# Patient Record
Sex: Female | Born: 1995
Health system: Southern US, Community
[De-identification: ages and names within clinical notes are randomized; demographics above are authoritative.]

## PROBLEM LIST (undated history)

## (undated) DIAGNOSIS — D649 Anemia, unspecified: Secondary | ICD-10-CM

## (undated) DIAGNOSIS — F419 Anxiety disorder, unspecified: Secondary | ICD-10-CM

## (undated) HISTORY — PX: NO PAST SURGERIES: SHX2092

---
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
## 2018-01-15 ENCOUNTER — Ambulatory Visit (HOSPITAL_COMMUNITY)
Admission: EM | Admit: 2018-01-15 | Discharge: 2018-01-15 | Disposition: A | Discharge status: Home / Self Care | Payer: 59 | Attending: Family Medicine | Admitting: Family Medicine

## 2018-01-15 ENCOUNTER — Encounter (HOSPITAL_COMMUNITY): Payer: Self-pay | Admitting: Family Medicine

## 2018-01-15 DIAGNOSIS — S29012A Strain of muscle and tendon of back wall of thorax, initial encounter: Secondary | ICD-10-CM

## 2018-01-15 MED ORDER — DICLOFENAC SODIUM 75 MG PO TBEC: 75.0000 mg | DELAYED_RELEASE_TABLET | Freq: Two times a day (BID) | ORAL | 0 refills | Status: DC

## 2018-01-15 MED ORDER — CYCLOBENZAPRINE HCL 10 MG PO TABS: ORAL_TABLET | ORAL | 0 refills | Status: DC
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
## 2018-01-15 NOTE — Discharge Instructions (Signed)
HOME CARE INSTRUCTIONS: For many people, back pain returns. Since back pain is rarely dangerous, it is often a condition that people can learn to manage on their own. Please remain active. It is stressful on the back to sit or stand in one place. Do not sit, drive, or stand in one place for more than 30 minutes at a time. Take short walks on level surfaces as soon as pain allows. Try to increase the length of time you walk each day. Do not stay in bed. Resting more than 1 or 2 days can delay your recovery. Do not avoid exercise or work. Your body is made to move. It is not dangerous to be active, even though your back may hurt. Your back will likely heal faster if you return to being active before your pain is gone. Over-the-counter medicines to reduce pain and inflammation are often the most helpful. ° °SEEK MEDICAL CARE IF: °You have pain that is not relieved with rest or medicine. °You have pain that does not improve in 1 week. °You have new symptoms. °You are generally not feeling well. ° °SEEK IMMEDIATE MEDICAL CARE IF: °You have pain that radiates from your back into your legs. °You develop new bowel or bladder control problems. °You have unusual weakness or numbness in your arms or legs. °You develop nausea or vomiting. °You develop abdominal pain. °You feel faint. ° °

## 2019-02-16 ENCOUNTER — Ambulatory Visit: Payer: 59 | Attending: Internal Medicine

## 2019-02-16 DIAGNOSIS — Z20822 Contact with and (suspected) exposure to covid-19: Secondary | ICD-10-CM

## 2019-02-17 LAB — NOVEL CORONAVIRUS, NAA: SARS-CoV-2, NAA: NOT DETECTED

## 2020-11-18 ENCOUNTER — Other Ambulatory Visit: Payer: Self-pay

## 2020-11-18 ENCOUNTER — Encounter (HOSPITAL_COMMUNITY): Payer: Self-pay | Admitting: Obstetrics and Gynecology

## 2020-11-18 ENCOUNTER — Inpatient Hospital Stay (HOSPITAL_COMMUNITY)
Admission: AD | Admit: 2020-11-18 | Discharge: 2020-11-18 | Disposition: A | Discharge status: Home / Self Care | Payer: Medicaid Other | Attending: Obstetrics and Gynecology | Admitting: Obstetrics and Gynecology

## 2020-11-18 DIAGNOSIS — Z79899 Other long term (current) drug therapy: Secondary | ICD-10-CM | POA: Diagnosis not present

## 2020-11-18 DIAGNOSIS — O219 Vomiting of pregnancy, unspecified: Secondary | ICD-10-CM

## 2020-11-18 DIAGNOSIS — Z3A10 10 weeks gestation of pregnancy: Secondary | ICD-10-CM

## 2020-11-18 DIAGNOSIS — Z888 Allergy status to other drugs, medicaments and biological substances status: Secondary | ICD-10-CM | POA: Insufficient documentation

## 2020-11-18 HISTORY — DX: Anemia, unspecified: D64.9

## 2020-11-18 HISTORY — DX: Anxiety disorder, unspecified: F41.9

## 2020-11-18 LAB — URINALYSIS, ROUTINE W REFLEX MICROSCOPIC
Bilirubin Urine: NEGATIVE
Glucose, UA: NEGATIVE mg/dL
Hgb urine dipstick: NEGATIVE
Ketones, ur: 80 mg/dL — AB
Nitrite: NEGATIVE
Protein, ur: 30 mg/dL — AB
RBC / HPF: >50 RBC/hpf — ABNORMAL HIGH (ref 0–5)
Specific Gravity, Urine: 1.026 (ref 1.005–1.030)
pH: 5 (ref 5.0–8.0)

## 2020-11-18 LAB — COMPREHENSIVE METABOLIC PANEL
ALT: 42 U/L (ref 0–44)
AST: 26 U/L (ref 15–41)
Albumin: 3.9 g/dL (ref 3.5–5.0)
Alkaline Phosphatase: 60 U/L (ref 38–126)
Anion gap: 10 (ref 5–15)
BUN: 7 mg/dL (ref 6–20)
CO2: 22 mmol/L (ref 22–32)
Calcium: 9.4 mg/dL (ref 8.9–10.3)
Chloride: 103 mmol/L (ref 98–111)
Creatinine, Ser: 0.58 mg/dL (ref 0.44–1.00)
GFR, Estimated: >60 mL/min (ref >60)
Glucose, Bld: 86 mg/dL (ref 70–99)
Potassium: 3.7 mmol/L (ref 3.5–5.1)
Sodium: 135 mmol/L (ref 135–145)
Total Bilirubin: 0.8 mg/dL (ref 0.3–1.2)
Total Protein: 7.1 g/dL (ref 6.5–8.1)

## 2020-11-18 LAB — CBC WITH DIFFERENTIAL/PLATELET
Abs Immature Granulocytes: 0.02 10*3/uL (ref 0.00–0.07)
Basophils Absolute: 0 10*3/uL (ref 0.0–0.1)
Basophils Relative: 0 %
Eosinophils Absolute: 0 10*3/uL (ref 0.0–0.5)
Eosinophils Relative: 0 %
HCT: 37.5 % (ref 36.0–46.0)
Hemoglobin: 11.9 g/dL — ABNORMAL LOW (ref 12.0–15.0)
Immature Granulocytes: 0 %
Lymphocytes Relative: 14 %
Lymphs Abs: 1.1 10*3/uL (ref 0.7–4.0)
MCH: 25.8 pg — ABNORMAL LOW (ref 26.0–34.0)
MCHC: 31.7 g/dL (ref 30.0–36.0)
MCV: 81.2 fL (ref 80.0–100.0)
Monocytes Absolute: 0.4 10*3/uL (ref 0.1–1.0)
Monocytes Relative: 5 %
Neutro Abs: 6.4 10*3/uL (ref 1.7–7.7)
Neutrophils Relative %: 81 %
Platelets: 266 10*3/uL (ref 150–400)
RBC: 4.62 MIL/uL (ref 3.87–5.11)
RDW: 16.6 % — ABNORMAL HIGH (ref 11.5–15.5)
WBC: 7.9 10*3/uL (ref 4.0–10.5)
nRBC: 0 % (ref 0.0–0.2)

## 2020-11-18 LAB — ABO/RH
ABO/RH(D): O NEG
Antibody Screen: NEGATIVE

## 2020-11-18 LAB — POCT PREGNANCY, URINE: Preg Test, Ur: POSITIVE — AB

## 2020-11-18 MED ORDER — ONDANSETRON 4 MG PO TBDP: 4.0000 mg | ORAL_TABLET | Freq: Three times a day (TID) | ORAL | 1 refills | Status: AC | PRN

## 2020-11-18 MED ORDER — FAMOTIDINE 20 MG PO TABS: 20.0000 mg | ORAL_TABLET | Freq: Every day | ORAL | 1 refills | Status: DC

## 2020-11-18 MED ORDER — ONDANSETRON 4 MG PO TBDP: 4.0000 mg | ORAL_TABLET | Freq: Three times a day (TID) | ORAL | 1 refills | Status: DC | PRN

## 2020-11-18 MED ORDER — LACTATED RINGERS IV BOLUS
1000.0000 mL | Freq: Once | INTRAVENOUS | Status: AC
Start: 2020-11-18 — End: 2020-11-18
  Administered 2020-11-18: 1000 mL via INTRAVENOUS

## 2020-11-18 MED ORDER — FAMOTIDINE IN NACL 20-0.9 MG/50ML-% IV SOLN
20.0000 mg | Freq: Once | INTRAVENOUS | Status: AC
  Administered 2020-11-18: 20 mg via INTRAVENOUS
  Filled 2020-11-18: qty 50

## 2020-11-18 MED ORDER — ONDANSETRON HCL 4 MG/2ML IJ SOLN
4.0000 mg | Freq: Once | INTRAMUSCULAR | Status: AC
  Administered 2020-11-18: 4 mg via INTRAVENOUS
  Filled 2020-11-18: qty 2

## 2020-11-18 MED ORDER — FAMOTIDINE 20 MG PO TABS: 20.0000 mg | ORAL_TABLET | Freq: Every day | ORAL | 1 refills | Status: AC

## 2020-11-18 NOTE — MAU Note (Signed)
Pt reports to mau with c/o not being able to keep anything down.  Reports she vomited 6X yesterday and 4 times today.  States her unisom and b6 are not helping.  Last took meds a few days ago.  Denies vag bleeding.

## 2020-11-18 NOTE — Discharge Instructions (Signed)

## 2020-11-18 NOTE — MAU Provider Note (Addendum)
History     CSN: [id number]  Arrival date and time: 11/18/20 1035   Event Date/Time   First Provider Initiated Contact with Patient 11/18/20 1130      Chief Complaint  Patient presents with   Emesis   Nausea   Danielle Perkins is a 25 y.o. G2P0010 at [redacted]w[redacted]d who presents to MAU for nausea and vomiting. Patient states she has had issues with nausea and vomiting since 4 weeks of pregnancy. Patient states there has only been one day during this time that she has only had one day that she has not vomited. Patient states she came to MAU today because for the past 3-4 days she has not been able to keep anything down, and has not had a meal for the past 3 days. Patient states she has not been able to keep down liquids for the past 3 days. Patient states she has vomiting at least 8 times in the past 24 hours. Patient states she is vomiting within 28minutes after eating and drinking, but has been typically within 1-2 hours. Patient states today has been the worst, which is why she came to MAU for evaluation.  Patient states she knows she is 10 weeks because she had an early Korea at 7 weeks to confirm dating.  Pt denies VB, LOF, ctx, decreased FM, vaginal discharge/odor/itching. Pt denies abdominal pain, constipation, diarrhea, or urinary problems. Pt denies fever, chills, fatigue, sweating or changes in appetite. Pt denies SOB or chest pain. Pt denies dizziness, HA, light-headedness, weakness.  Problems this pregnancy include: pt has not yet been seen. Allergies? NKDA Current medications/supplements? Progesterone PV, Lexapro 10mg , iron, PNV, B6, Unisom, Dramamine PRN [facility]ren[facility] care provider? [facility], [facility]/20[facility]   OB History     Gravida  2   Para      Term      Preterm      AB  1   Living         SAB  1   IAB      Ectopic      Multiple      Live Births              Past Medical History:  Diagnosis Date   Anemia    Anxiety     Past Surgical History:   Procedure Laterality Date   NO PAST SURGERIES      Family History  Problem Relation Age of Onset   Breast cancer Maternal Grandmother    Diabetes Paternal Grandmother     Social History   Tobacco Use   Smoking status: Never   Smokeless tobacco: Never  Vaping Use   Vaping Use: Never used  Substance Use Topics   Alcohol use: Not Currently   Drug use: Never    Allergies: No Known Allergies  Medications Prior to Admission  Medication Sig Dispense Refill Last Dose   dimenhyDRINATE (DRAMAMINE) 50 MG tablet Take 50 mg by mouth 2 (two) times daily at 10 AM and 5 PM.   11/17/2020   escitalopram (LEXAPRO) 10 MG tablet Take 10 mg by mouth daily.   11/17/2020   ferrous sulfate 325 (65 FE) MG tablet Take 325 mg by mouth daily with breakfast.   11/17/2020   Prenatal Vit-Fe Fumarate-FA (MULTIVITAMIN-PRENATAL) 27-0.8 MG TABS tablet Take 1 tablet by mouth daily at 12 noon.   11/17/2020   progesterone 200 MG SUPP Place 200 mg vaginally daily.   11/18/2020   cyclobenzaprine (FLEXERIL) 10 MG tablet Take 1  tablet by mouth before bed as needed for muscle spasm. Warning: May cause drowsiness. 10 tablet 0    diclofenac (VOLTAREN) 75 MG EC tablet Take 1 tablet (75 mg total) by mouth 2 (two) times daily. 14 tablet 0     Review of Systems  Constitutional:  Negative for chills, diaphoresis, fatigue and fever.  Eyes:  Negative for visual disturbance.  Respiratory:  Negative for shortness of breath.   Cardiovascular:  Negative for chest pain.  Gastrointestinal:  Positive for nausea and vomiting. Negative for abdominal pain, constipation and diarrhea.  Genitourinary:  Negative for dysuria, flank pain, frequency, pelvic pain, urgency, vaginal bleeding and vaginal discharge.  Neurological:  Negative for dizziness, weakness, light-headedness and headaches.   Physical Exam   Blood pressure 131/75, pulse 88, temperature 98.2 F (36.8 C), temperature source Oral, resp. rate 16, last menstrual period  09/06/2020, SpO2 100 %.  Patient Vitals for the past 24 hrs:  BP Temp Temp src Pulse Resp SpO2  11/18/20 1121 131/75 -- -- 88 16 100 %  11/18/20 1055 132/75 98.2 F (36.8 C) Oral 86 16 --   Physical Exam Vitals and nursing note reviewed.  Constitutional:      General: She is not in acute distress.    Appearance: Normal appearance. She is not ill-appearing, toxic-appearing or diaphoretic.  HENT:     Head: Normocephalic and atraumatic.  Pulmonary:     Effort: Pulmonary effort is normal.  Neurological:     Mental Status: She is alert and oriented to person, place, and time.  Psychiatric:        Mood and Affect: Mood normal.        Behavior: Behavior normal.        Thought Content: Thought content normal.        Judgment: Judgment normal.   Results for orders placed or performed during the hospital encounter of 11/18/20 (from the past 24 hour(s))  Pregnancy, urine POC     Status: Abnormal   Collection Time: 11/18/20 10:48 AM  Result Value Ref Range   Preg Test, Ur POSITIVE (A) NEGATIVE  Urinalysis, Routine w reflex microscopic Urine, Clean Catch     Status: Abnormal   Collection Time: 11/18/20 11:01 AM  Result Value Ref Range   Color, Urine AMBER (A) YELLOW   APPearance CLOUDY (A) CLEAR   Specific Gravity, Urine 1.026 1.005 - 1.030   pH 5.0 5.0 - 8.0   Glucose, UA NEGATIVE NEGATIVE mg/dL   Hgb urine dipstick NEGATIVE NEGATIVE   Bilirubin Urine NEGATIVE NEGATIVE   Ketones, ur 80 (A) NEGATIVE mg/dL   Protein, ur 30 (A) NEGATIVE mg/dL   Nitrite NEGATIVE NEGATIVE   Leukocytes,Ua SMALL (A) NEGATIVE   RBC / HPF >50 (H) 0 - 5 RBC/hpf   WBC, UA 21-50 0 - 5 WBC/hpf   Bacteria, UA MANY (A) NONE SEEN   Squamous Epithelial / LPF 21-50 0 - 5   Mucus PRESENT    Non Squamous Epithelial 0-5 (A) NONE SEEN  CBC with Differential/Platelet     Status: Abnormal   Collection Time: 11/18/20 11:55 AM  Result Value Ref Range   WBC 7.9 4.0 - 10.5 K/uL   RBC 4.62 3.87 - 5.11 MIL/uL    Hemoglobin 11.9 (L) 12.0 - 15.0 g/dL   HCT 37.5 36.0 - 46.0 %   MCV 81.2 80.0 - 100.0 fL   MCH 25.8 (L) 26.0 - 34.0 pg   MCHC 31.7 30.0 - 36.0 g/dL   RDW  16.6 (H) 11.5 - 15.5 %   Platelets 266 150 - 400 K/uL   nRBC 0.0 0.0 - 0.2 %   Neutrophils Relative % 81 %   Neutro Abs 6.4 1.7 - 7.7 K/uL   Lymphocytes Relative 14 %   Lymphs Abs 1.1 0.7 - 4.0 K/uL   Monocytes Relative 5 %   Monocytes Absolute 0.4 0.1 - 1.0 K/uL   Eosinophils Relative 0 %   Eosinophils Absolute 0.0 0.0 - 0.5 K/uL   Basophils Relative 0 %   Basophils Absolute 0.0 0.0 - 0.1 K/uL   Immature Granulocytes 0 %   Abs Immature Granulocytes 0.02 0.00 - 0.07 K/uL  Comprehensive metabolic panel     Status: None   Collection Time: 11/18/20 11:55 AM  Result Value Ref Range   Sodium 135 135 - 145 mmol/L   Potassium 3.7 3.5 - 5.1 mmol/L   Chloride 103 98 - 111 mmol/L   CO2 22 22 - 32 mmol/L   Glucose, Bld 86 70 - 99 mg/dL   BUN 7 6 - 20 mg/dL   Creatinine, Ser 0.58 0.44 - 1.00 mg/dL   Calcium 9.4 8.9 - 10.3 mg/dL   Total Protein 7.1 6.5 - 8.1 g/dL   Albumin 3.9 3.5 - 5.0 g/dL   AST 26 15 - 41 U/L   ALT 42 0 - 44 U/L   Alkaline Phosphatase 60 38 - 126 U/L   Total Bilirubin 0.8 0.3 - 1.2 mg/dL   GFR, Estimated >60 >60 mL/min   Anion gap 10 5 - 15  ABO/Rh     Status: None   Collection Time: 11/18/20 11:55 AM  Result Value Ref Range   ABO/RH(D) O NEG    Antibody Screen      NEG Performed at [facility], [address] [place], [place] [address]    MAU Course  Procedures  MDM -N/V in pregnancy -UA: amber/cloudy/80ketones/30PRO/sm leuks/many bacteria, sending urine for culture -CBC w/Diff: WNL -CMP: WNL -1L LR + 20mg  Pepcid + 4mg  Zofran given, pt reports NV now resolved -pt able to urinate after fluids -PO challenge successful -pt discharged to home in stable condition  Orders Placed This Encounter  Procedures   Urinalysis, Routine w reflex microscopic Urine, Clean Catch    Standing Status:    Standing    Number of Occurrences:   1   CBC with Differential/Platelet    Standing Status:   Standing    Number of Occurrences:   1   Comprehensive metabolic panel    Standing Status:   Standing    Number of Occurrences:   1   Pregnancy, urine POC    Standing Status:   Standing    Number of Occurrences:   1   ABO/Rh    Standing Status:   Standing    Number of Occurrences:   1   Insert peripheral IV    Standing Status:   Standing    Number of Occurrences:   1   Meds ordered this encounter  Medications   ondansetron (ZOFRAN) injection 4 mg   famotidine (PEPCID) IVPB 20 mg premix   lactated ringers bolus 1,000 mL   Assessment and Plan   1. Nausea and vomiting in pregnancy   2. [redacted] weeks gestation of pregnancy    Allergies as of 11/18/2020   No Known Allergies      Medication List     STOP taking these medications    diclofenac 75 MG EC  tablet Commonly known as: VOLTAREN   dimenhyDRINATE 50 MG tablet Commonly known as: DRAMAMINE       TAKE these medications    cyclobenzaprine 10 MG tablet Commonly known as: FLEXERIL Take 1 tablet by mouth before bed as needed for muscle spasm. Warning: May cause drowsiness.   escitalopram 10 MG tablet Commonly known as: LEXAPRO Take 10 mg by mouth daily.   famotidine 20 MG tablet Commonly known as: Pepcid Take 1 tablet (20 mg total) by mouth daily.   ferrous sulfate 325 (65 FE) MG tablet Take 325 mg by mouth daily with breakfast.   multivitamin-prenatal 27-0.8 MG Tabs tablet Take 1 tablet by mouth daily at 12 noon.   ondansetron 4 MG disintegrating tablet Commonly known as: Zofran ODT Take 1 tablet (4 mg total) by mouth every 8 (eight) hours as needed for nausea or vomiting.   progesterone 200 MG Supp Place 200 mg vaginally daily.       -will call with culture results, if positive -safe meds in pregnancy list given -pt advised to take medications around the clock and not to stop taking if feeling better -RX  Zofran -RX Pepcid -can continue Unisom and B6 -discussed nonpharmacologic and pharmacologic treatments of N/V -discussed normal expectations for N/V in pregnancy -RH negative status discussed -pt discharged to home in stable condition  Elmyra Ricks E Tinsleigh Slovacek 11/18/2020, 11:36 AM

## 2020-11-19 LAB — CULTURE, OB URINE

## 2020-11-20 ENCOUNTER — Other Ambulatory Visit (HOSPITAL_COMMUNITY)
Admission: RE | Admit: 2020-11-20 | Discharge: 2020-11-20 | Disposition: A | Discharge status: Home / Self Care | Payer: Medicaid Other | Source: Ambulatory Visit | Attending: Family Medicine | Admitting: Family Medicine

## 2020-11-20 ENCOUNTER — Telehealth (INDEPENDENT_AMBULATORY_CARE_PROVIDER_SITE_OTHER): Payer: Medicaid Other

## 2020-11-20 DIAGNOSIS — D485 Neoplasm of uncertain behavior of skin: Secondary | ICD-10-CM | POA: Insufficient documentation

## 2020-11-20 DIAGNOSIS — Z3490 Encounter for supervision of normal pregnancy, unspecified, unspecified trimester: Secondary | ICD-10-CM | POA: Insufficient documentation

## 2020-11-20 DIAGNOSIS — O219 Vomiting of pregnancy, unspecified: Secondary | ICD-10-CM

## 2020-11-20 DIAGNOSIS — Z3A00 Weeks of gestation of pregnancy not specified: Secondary | ICD-10-CM

## 2020-11-20 DIAGNOSIS — O09299 Supervision of pregnancy with other poor reproductive or obstetric history, unspecified trimester: Secondary | ICD-10-CM | POA: Diagnosis not present

## 2020-11-20 DIAGNOSIS — F419 Anxiety disorder, unspecified: Secondary | ICD-10-CM | POA: Insufficient documentation

## 2020-11-20 DIAGNOSIS — O99340 Other mental disorders complicating pregnancy, unspecified trimester: Secondary | ICD-10-CM | POA: Diagnosis not present

## 2020-11-20 MED ORDER — BLOOD PRESSURE KIT DEVI: 1.0000 | Freq: Once | 0 refills | Status: AC

## 2020-11-20 NOTE — Progress Notes (Signed)
New OB Intake  I connected with  Danielle Perkins on 11/20/20 at  2:15 PM EDT by MyChart Video Visit and verified that I am speaking with the correct person using two identifiers. Nurse is located at [facility] and pt is located at home.  I discussed the limitations, risks, security and privacy concerns of performing an evaluation and management service by telephone and the availability of in person appointments. I also discussed with the patient that there may be a patient responsible charge related to this service. The patient expressed understanding and agreed to proceed.  I explained I am completing New OB Intake today. We discussed her EDD of 06/13/20 that is based on LMP of 09/06/20. Pt is G2/P0. I reviewed her allergies, medications, Medical/Surgical/OB history, and appropriate screenings. I informed her of [facility] services. [facility] information placed in AVS. Based on history, this is an uncomplicated pregnancy.  Patient Active Problem List   Diagnosis Date Noted   Supervision of low-risk pregnancy, unspecified trimester 11/20/2020   Neoplasm of uncertain behavior of skin 11/20/2020   Anxiety 11/20/2020   Concerns addressed today Patient reports history of anxiety, currently taking Lexapro 10 mg daily. Pt becomes tearful while reporting prior miscarriage. Asks when we will be able to do an ultrasound to check on baby. I explained her first Korea will be at or around 19 weeks unless there is cause for concern. Offered for pt to come into office today for FHR and new OB labs. Pt agreeable.   Reports nausea has improved since visit to MAU. Continues to take Zofran and Pepcid regularly.   Delivery Plans:  Plans to deliver at [facility].   MyChart/Babyscripts MyChart access verified. I explained pt will have some visits in office and some virtually. Babyscripts instructions given and order placed. Patient verifies receipt of registration text/e-mail.  Blood Pressure Cuff  Blood pressure cuff ordered for  patient to pick-up from [employer]. Explained after first prenatal appt pt will check weekly and document in 70.  Weight scale: Patient    have weight scale. Weight scale ordered for patient to pick up form [facility].   Anatomy US Explained first scheduled Korea will be around 19 weeks. Anatomy US scheduled for 01/17/21 at 1430.   Labs Discussed Johnsie Cancel genetic screening with patient. Would like both Panorama and Horizon drawn. Pt will come into office this afternoon for labs.  COVID Vaccine Patient has not had covid vaccine.   Mother/ Baby Dyad Candidate?    Yes, pt asks to speak with husband about program prior to enrolling. Explained that I will request Colletta Maryland, RN call patient toward the end of week to follow up.   Social Determinants of Health Food Insecurity: Patient denies food insecurity. WIC Referral: Patient is interested in referral to [facility].  Transportation: Patient denies transportation needs. Childcare: Discussed no children allowed at ultrasound appointments. Offered childcare services; patient declines childcare services at this time.  First visit review I reviewed new OB appt with pt. I explained she will have a physical exam with provider. Explained pt will be seen by Dione Plover, MD at first visit; encounter routed to appropriate provider. Explained that patient will be seen by pregnancy navigator following visit with provider.  Patient came into office for brief in person visit for FHR, blood pressure, and prenatal labs.  Annabell Howells, RN 11/20/2020  4:20 PM

## 2020-11-20 NOTE — Patient Instructions (Addendum)
Safe Medications in Pregnancy   Acne:  Benzoyl Peroxide  Salicylic Acid   Backache/Headache:  Tylenol: 2 regular strength every 4 hours OR               2 Extra strength every 6 hours   Colds/Coughs/Allergies:  Benadryl (alcohol free) 25 mg every 6 hours as needed  Breath right strips  Claritin  Cepacol throat lozenges  Chloraseptic throat spray  Cold-Eeze- up to three times per day  Cough drops, alcohol free  Flonase (by prescription only)  Guaifenesin  Mucinex  Robitussin DM (plain only, alcohol free)  Saline nasal spray/drops  Sudafed (pseudoephedrine) & Actifed * use only after [redacted] weeks gestation and if you do not have high blood pressure  Tylenol  Vicks Vaporub  Zinc lozenges  Zyrtec   Constipation:  Colace  Ducolax suppositories  Fleet enema  Glycerin suppositories  Metamucil  Milk of magnesia  Miralax  Senokot  Smooth move tea   Diarrhea:  Kaopectate  Imodium A-D   *NO pepto Bismol   Hemorrhoids:  Anusol  Anusol HC  Preparation H  Tucks   Indigestion:  Tums  Maalox  Mylanta  Zantac  Pepcid   Insomnia:  Benadryl (alcohol free) 25mg  every 6 hours as needed  Tylenol PM  Unisom, no Gelcaps   Leg Cramps:  Tums  MagGel   Nausea/Vomiting:  Bonine  Dramamine  Emetrol  Ginger extract  Sea bands  Meclizine  Nausea medication to take during pregnancy:  Unisom (doxylamine succinate 25 mg tablets) Take one tablet daily at bedtime. If symptoms are not adequately controlled, the dose can be increased to a maximum recommended dose of two tablets daily (1/2 tablet in the morning, 1/2 tablet mid-afternoon and one at bedtime).  Vitamin B6 100mg  tablets. Take one tablet twice a day (up to 200 mg per day).   Skin Rashes:  Aveeno products  Benadryl cream or 25mg  every 6 hours as needed  Calamine Lotion  1% cortisone cream   Yeast infection:  Gyne-lotrimin 7  Monistat 7    **If taking multiple medications, please check labels to avoid  duplicating the same active ingredients  **take medication as directed on the label  ** Do not exceed 4000 mg of tylenol in 24 hours  **Do not take medications that contain aspirin or ibuprofen           [facility] [address], [place], [place] [address] [phone] Hours: Sunday Closed Monday 9AM-6PM Tuesday 9AM-6PM Wednesday 9AM-6PM Thursday 9AM-6PM Friday           9AM-6PM Saturday         10 AM-1PM  [place] At our [facility] OB/GYN Practices, we work as an integrated team, providing care to address both physical and emotional health. Your medical provider may refer you to see our [facility]) on the same day you see your medical provider, as availability permits; often scheduled virtually at your convenience.  Our [facility] is available to all patients, visits generally last between 20-30 minutes, but can be longer or shorter, depending on patient need. The [facility] offers help with stress management, coping with symptoms of depression and anxiety, major life changes , sleep issues, changing risky behavior, grief and loss, life stress, working on personal life goals, and  behavioral health issues, as these all affect your overall health and wellness.  The [facility] is NOT available for the following: FMLA paperwork, court-ordered evaluations, specialty assessments (custody or disability), letters to employers,  or obtaining certification for an emotional support animal. The [facility] does not provide long-term therapy. You have the right to refuse integrated behavioral health services, or to reschedule to see the [facility] at a later date.  Confidentiality exception: If it is suspected that a child or disabled adult is being abused or neglected, we are required by law to report that to either Child Protective Services or Adult Scientist, forensic.  If you have a diagnosis of Bipolar affective disorder, Schizophrenia, or recurrent Major depressive disorder, we will recommend that  you establish care with a psychiatrist, as these are lifelong, chronic conditions, and we want your overall emotional health and medications to be more closely monitored. If you anticipate needing extended maternity leave due to mental health issues postpartum, it it recommended you inform your medical provider, so we can put in a referral to a psychiatrist as soon as possible. The [facility] is unable to recommend an extended maternity leave for mental health issues. Your medical provider or [facility] may refer you to a therapist for ongoing, traditional therapy, or to a psychiatrist, for medication management, if it would benefit your overall health. Depending on your insurance, you may have a copay or be charged a deductible, depending on your insurance, to see the [facility]. If you are uninsured, it is recommended that you apply for financial assistance. (Forms may be requested at the front desk for in-person visits, via MyChart, or request a form during a virtual visit).  If you see the [facility] more than 6 times, you will have to complete a comprehensive clinical assessment interview with the [facility] to resume integrated services.  For virtual visits with the [facility], you must be physically in the state of [place] at the time of the visit. For example, if you live in [place], you will have to do an in-person visit with the [facility], and your out-of-state insurance may not cover behavioral health services in [place]. If you are going out of the state or country for any reason, the [facility] may see you virtually when you return to [place], but not while you are physically outside of [place].

## 2020-11-21 LAB — HIV ANTIBODY (ROUTINE TESTING W REFLEX): HIV Screen 4th Generation wRfx: NONREACTIVE

## 2020-11-21 LAB — GC/CHLAMYDIA PROBE AMP (~~LOC~~) NOT AT ARMC
Chlamydia: NEGATIVE
Comment: NEGATIVE
Comment: NORMAL
Neisseria Gonorrhea: NEGATIVE

## 2020-11-21 LAB — HEPATITIS B SURFACE ANTIGEN: Hepatitis B Surface Ag: NEGATIVE

## 2020-11-21 LAB — RUBELLA ANTIBODY, IGM: Rubella IgM: <20 AU/mL (ref 0.0–19.9)

## 2020-11-21 LAB — RPR: RPR Ser Ql: NONREACTIVE

## 2020-11-21 LAB — ANTIBODY SCREEN: Antibody Screen: NEGATIVE

## 2020-11-21 LAB — HEPATITIS C ANTIBODY: Hep C Virus Ab: <0.1 s/co ratio (ref 0.0–0.9)

## 2020-11-22 LAB — CULTURE, OB URINE

## 2020-11-22 LAB — URINE CULTURE, OB REFLEX

## 2020-12-07 ENCOUNTER — Encounter: Payer: 59 | Admitting: Family Medicine

## 2020-12-10 ENCOUNTER — Ambulatory Visit: Payer: Medicaid Other

## 2020-12-11 ENCOUNTER — Other Ambulatory Visit: Payer: Self-pay

## 2020-12-11 ENCOUNTER — Ambulatory Visit (INDEPENDENT_AMBULATORY_CARE_PROVIDER_SITE_OTHER): Payer: Medicaid Other

## 2020-12-11 VITALS — BP 120/69 | HR 84 | Wt 204.0 lb

## 2020-12-11 DIAGNOSIS — R519 Headache, unspecified: Secondary | ICD-10-CM

## 2020-12-11 DIAGNOSIS — Z3490 Encounter for supervision of normal pregnancy, unspecified, unspecified trimester: Secondary | ICD-10-CM

## 2020-12-11 DIAGNOSIS — O219 Vomiting of pregnancy, unspecified: Secondary | ICD-10-CM

## 2020-12-11 DIAGNOSIS — O26899 Other specified pregnancy related conditions, unspecified trimester: Secondary | ICD-10-CM

## 2020-12-11 MED ORDER — DOXYLAMINE-PYRIDOXINE 10-10 MG PO TBEC: 2.0000 | DELAYED_RELEASE_TABLET | Freq: Every evening | ORAL | 2 refills | Status: DC | PRN

## 2020-12-11 MED ORDER — METOCLOPRAMIDE HCL 10 MG PO TABS: 10.0000 mg | ORAL_TABLET | Freq: Two times a day (BID) | ORAL | 0 refills | Status: DC | PRN

## 2020-12-11 NOTE — Patient Instructions (Signed)
[facility]  [email or web address] [phone] Hours: Mon-Fri: 8AM to [place]  At our [facility] OB/GYN Practices, we work as an integrated team, providing care to address both physical and emotional health. Your medical provider may refer you to see our [facility]) on the same day you see your medical provider, as availability permits; often scheduled virtually at your convenience.  Our [facility] is available to all patients, visits generally last between 20-30 minutes, but can be longer or shorter, depending on patient need. The [facility] offers help with stress management, coping with symptoms of depression and anxiety, major life changes , sleep issues, changing risky behavior, grief and loss, life stress, working on personal life goals, and  behavioral health issues, as these all affect your overall health and wellness.  The [facility] is NOT available for the following: FMLA paperwork, court-ordered evaluations, specialty assessments (custody or disability), letters to employers, or obtaining certification for an emotional support animal. The [facility] does not provide long-term therapy. You have the right to refuse integrated behavioral health services, or to reschedule to see the [facility] at a later date.  Confidentiality exception: If it is suspected that a child or disabled adult is being abused or neglected, we are required by law to report that to either Child Protective Services or Adult Scientist, forensic.  If you have a diagnosis of Bipolar affective disorder, Schizophrenia, or recurrent Major depressive disorder, we will recommend that you establish care with a psychiatrist, as these are lifelong, chronic conditions, and we want your overall emotional health and medications to be more closely monitored. If you anticipate needing extended maternity leave due to mental health issues postpartum, it it recommended you inform your medical provider, so we can put in a  referral to a psychiatrist as soon as possible. The [facility] is unable to recommend an extended maternity leave for mental health issues. Your medical provider or [facility] may refer you to a therapist for ongoing, traditional therapy, or to a psychiatrist, for medication management, if it would benefit your overall health. Depending on your insurance, you may have a copay or be charged a deductible, depending on your insurance, to see the [facility]. If you are uninsured, it is recommended that you apply for financial assistance. (Forms may be requested at the front desk for in-person visits, via MyChart, or request a form during a virtual visit).  If you see the [facility] more than 6 times, you will have to complete a comprehensive clinical assessment interview with the [facility] to resume integrated services.  For virtual visits with the [facility], you must be physically in the state of [place] at the time of the visit. For example, if you live in [place], you will have to do an in-person visit with the [facility], and your out-of-state insurance may not cover behavioral health services in [place]. If you are going out of the state or country for any reason, the [facility] may see you virtually when you return to [place], but not while you are physically outside of [place].

## 2020-12-11 NOTE — Progress Notes (Signed)
Mom/Baby Dyad new OB visit today cancelled due to sick provider; rescheduled for 12/18/20.  Pt here today for nurse visit for FHR. FHR is 152. BP WNL.   Reports ongoing nausea and vomiting. Patient is concerned about not gaining weight. Reassured pt that maintaining weight at this point in pregnancy is not a cause for concern. Currently taking Zofran every 8 hours. Also trying to eat small, frequent meal every 2 hours. Has tried ginger candies. Reports intermittent cramping to both of sides of lower abdomen. Denies vaginal bleeding. Return precautions given. Diclegis ordered per protocol.  Patient reports history of headaches prior to pregnancy. Reports experiencing headache 4-5 days each week since becoming pregnant. Reviewed with Higinio Plan, DO who states pt may try Reglan with extra strength Tylenol for headache. Concerning side effects of Reglan reviewed with patient.  Pt is tearful during appointment and endorses ongoing anxiety that has worsened with this pregnancy. Pt experienced recent miscarriage and is very worried about outcome of this pregnancy. Offered our [facility] services as well as community resources for counseling. Pt will consider, would not like to schedule today. Lattie Haw, prenatal navigator to bedside to enroll patient in Ready, Ready program.   Apolonio Schneiders RN 12/12/20

## 2020-12-12 NOTE — Progress Notes (Signed)
Patient was evaluated by nursing staff. Agree with assessment and plan.

## 2020-12-18 ENCOUNTER — Other Ambulatory Visit: Payer: Self-pay

## 2020-12-18 ENCOUNTER — Other Ambulatory Visit (HOSPITAL_COMMUNITY)
Admission: RE | Admit: 2020-12-18 | Discharge: 2020-12-18 | Disposition: A | Discharge status: Home / Self Care | Payer: Medicaid Other | Source: Ambulatory Visit | Attending: Family Medicine | Admitting: Family Medicine

## 2020-12-18 ENCOUNTER — Ambulatory Visit (INDEPENDENT_AMBULATORY_CARE_PROVIDER_SITE_OTHER): Payer: Medicaid Other | Admitting: Family Medicine

## 2020-12-18 VITALS — BP 114/70 | HR 103 | Ht 63.0 in | Wt 205.9 lb

## 2020-12-18 DIAGNOSIS — Z23 Encounter for immunization: Secondary | ICD-10-CM | POA: Diagnosis not present

## 2020-12-18 DIAGNOSIS — F419 Anxiety disorder, unspecified: Secondary | ICD-10-CM | POA: Diagnosis not present

## 2020-12-18 DIAGNOSIS — Z124 Encounter for screening for malignant neoplasm of cervix: Secondary | ICD-10-CM

## 2020-12-18 DIAGNOSIS — O26899 Other specified pregnancy related conditions, unspecified trimester: Secondary | ICD-10-CM | POA: Diagnosis not present

## 2020-12-18 DIAGNOSIS — Z6791 Unspecified blood type, Rh negative: Secondary | ICD-10-CM

## 2020-12-18 DIAGNOSIS — Z3490 Encounter for supervision of normal pregnancy, unspecified, unspecified trimester: Secondary | ICD-10-CM | POA: Diagnosis not present

## 2020-12-18 NOTE — Patient Instructions (Signed)
Second Trimester of Pregnancy The second trimester of pregnancy is from week 13 through week 27. This is months 4 through 6 of pregnancy. The second trimester is often a time when you feel your best. Your body has adjusted to being pregnant, and you begin to feel better physically. During the second trimester: Morning sickness has lessened or stopped completely. You may have more energy. You may have an increase in appetite. The second trimester is also a time when the unborn baby (fetus) is growing rapidly. At the end of the sixth month, the fetus may be up to 12 inches long and weigh about 1 pounds. You will likely begin to feel the baby move (quickening) between 16 and 20 weeks of pregnancy. Body changes during your second trimester Your body continues to go through many changes during your second trimester. The changes vary and generally return to normal after the baby is born. Physical changes Your weight will continue to increase. You will notice your lower abdomen bulging out. You may begin to get stretch marks on your hips, abdomen, and breasts. Your breasts will continue to grow and to become tender. Dark spots or blotches (chloasma or mask of pregnancy) may develop on your face. A dark line from your belly button to the pubic area (linea nigra) may appear. You may have changes in your hair. These can include thickening of your hair, rapid growth, and changes in texture. Some people also have hair loss during or after pregnancy, or hair that feels dry or thin. Health changes You may develop headaches. You may have heartburn. You may develop constipation. You may develop hemorrhoids or swollen, bulging veins (varicose veins). Your gums may bleed and may be sensitive to brushing and flossing. You may urinate more often because the fetus is pressing on your bladder. You may have back pain. This is caused by: Weight gain. Pregnancy hormones that are relaxing the joints in your  pelvis. A shift in weight and the muscles that support your balance. Follow these instructions at home: Medicines Follow your health care provider's instructions regarding medicine use. Specific medicines may be either safe or unsafe to take during pregnancy. Do not take any medicines unless approved by your health care provider. Take a prenatal vitamin that contains at least 600 micrograms (mcg) of folic acid. Eating and drinking Eat a healthy diet that includes fresh fruits and vegetables, whole grains, good sources of protein such as meat, eggs, or tofu, and low-fat dairy products. Avoid raw meat and unpasteurized juice, milk, and cheese. These carry germs that can harm you and your baby. You may need to take these actions to prevent or treat constipation: Drink enough fluid to keep your urine pale yellow. Eat foods that are high in fiber, such as beans, whole grains, and fresh fruits and vegetables. Limit foods that are high in fat and processed sugars, such as fried or sweet foods. Activity Exercise only as directed by your health care provider. Most people can continue their usual exercise routine during pregnancy. Try to exercise for 30 minutes at least 5 days a week. Stop exercising if you develop contractions in your uterus. Stop exercising if you develop pain or cramping in the lower abdomen or lower back. Avoid exercising if it is very hot or humid or if you are at a high altitude. Avoid heavy lifting. If you choose to, you may have sex unless your health care provider tells you not to. Relieving pain and discomfort Wear a supportive bra  to prevent discomfort from breast tenderness. Take warm sitz baths to soothe any pain or discomfort caused by hemorrhoids. Use hemorrhoid cream if your health care provider approves. Rest with your legs raised (elevated) if you have leg cramps or low back pain. If you develop varicose veins: Wear support hose as told by your health care  provider. Elevate your feet for 15 minutes, 3-4 times a day. Limit salt in your diet. Safety Wear your seat belt at all times when driving or riding in a car. Talk with your health care provider if someone is verbally or physically abusive to you. Lifestyle Do not use hot tubs, steam rooms, or saunas. Do not douche. Do not use tampons or scented sanitary pads. Avoid cat litter boxes and soil used by cats. These carry germs that can cause birth defects in the baby and possibly loss of the fetus by miscarriage or stillbirth. Do not use herbal remedies, alcohol, illegal drugs, or medicines that are not approved by your health care provider. Chemicals in these products can harm your baby. Do not use any products that contain nicotine or tobacco, such as cigarettes, e-cigarettes, and chewing tobacco. If you need help quitting, ask your health care provider. General instructions During a routine prenatal visit, your health care provider will do a physical exam and other tests. He or she will also discuss your overall health. Keep all follow-up visits. This is important. Ask your health care provider for a referral to a local prenatal education class. Ask for help if you have counseling or nutritional needs during pregnancy. Your health care provider can offer advice or refer you to specialists for help with various needs. Where to find more information American Pregnancy Association: americanpregnancy.[place] and Gynecologists: PoolDevices.com.pt Office on [employer]: KeywordPortfolios.com.br Contact a health care provider if you have: A headache that does not go away when you take medicine. Vision changes or you see spots in front of your eyes. Mild pelvic cramps, pelvic pressure, or nagging pain in the abdominal area. Persistent nausea, vomiting, or diarrhea. A bad-smelling vaginal discharge or foul-smelling urine. Pain when you  urinate. Sudden or extreme swelling of your face, hands, ankles, feet, or legs. A fever. Get help right away if you: Have fluid leaking from your vagina. Have spotting or bleeding from your vagina. Have severe abdominal cramping or pain. Have difficulty breathing. Have chest pain. Have fainting spells. Have not felt your baby move for the time period told by your health care provider. Have new or increased pain, swelling, or redness in an arm or leg. Summary The second trimester of pregnancy is from week 13 through week 27 (months 4 through 6). Do not use herbal remedies, alcohol, illegal drugs, or medicines that are not approved by your health care provider. Chemicals in these products can harm your baby. Exercise only as directed by your health care provider. Most people can continue their usual exercise routine during pregnancy. Keep all follow-up visits. This is important. This information is not intended to replace advice given to you by your health care provider. Make sure you discuss any questions you have with your health care provider. Document Revised: 07/13/2019 Document Reviewed: 05/19/2019 Elsevier Patient Education  2022 [employer].  Contraception Choices Contraception, also called birth control, refers to methods or devices that prevent pregnancy. Hormonal methods Contraceptive implant A contraceptive implant is a thin, plastic tube that contains a hormone that prevents pregnancy. It is different from an intrauterine device (IUD). It  is inserted into the upper part of the arm by a health care provider. Implants can be effective for up to 3 years. Progestin-only injections Progestin-only injections are injections of progestin, a synthetic form of the hormone progesterone. They are given every 3 months by a health care provider. Birth control pills Birth control pills are pills that contain hormones that prevent pregnancy. They must be taken once a day, preferably at the  same time each day. A prescription is needed to use this method of contraception. Birth control patch The birth control patch contains hormones that prevent pregnancy. It is placed on the skin and must be changed once a week for three weeks and removed on the fourth week. A prescription is needed to use this method of contraception. Vaginal ring A vaginal ring contains hormones that prevent pregnancy. It is placed in the vagina for three weeks and removed on the fourth week. After that, the process is repeated with a new ring. A prescription is needed to use this method of contraception. Emergency contraceptive Emergency contraceptives prevent pregnancy after unprotected sex. They come in pill form and can be taken up to 5 days after sex. They work best the sooner they are taken after having sex. Most emergency contraceptives are available without a prescription. This method should not be used as your only form of birth control. Barrier methods Female condom A female condom is a thin sheath that is worn over the penis during sex. Condoms keep sperm from going inside a woman's body. They can be used with a sperm-killing substance (spermicide) to increase their effectiveness. They should be thrown away after one use. Female condom A female condom is a soft, loose-fitting sheath that is put into the vagina before sex. The condom keeps sperm from going inside a woman's body. They should be thrown away after one use. Diaphragm A diaphragm is a soft, dome-shaped barrier. It is inserted into the vagina before sex, along with a spermicide. The diaphragm blocks sperm from entering the uterus, and the spermicide kills sperm. A diaphragm should be left in the vagina for 6-8 hours after sex and removed within 24 hours. A diaphragm is prescribed and fitted by a health care provider. A diaphragm should be replaced every 1-2 years, after giving birth, after gaining more than 15 lb (6.8 kg), and after pelvic  surgery. Cervical cap A cervical cap is a round, soft latex or plastic cup that fits over the cervix. It is inserted into the vagina before sex, along with spermicide. It blocks sperm from entering the uterus. The cap should be left in place for 6-8 hours after sex and removed within 48 hours. A cervical cap must be prescribed and fitted by a health care provider. It should be replaced every 2 years. Sponge A sponge is a soft, circular piece of polyurethane foam with spermicide in it. The sponge helps block sperm from entering the uterus, and the spermicide kills sperm. To use it, you make it wet and then insert it into the vagina. It should be inserted before sex, left in for at least 6 hours after sex, and removed and thrown away within 30 hours. Spermicides Spermicides are chemicals that kill or block sperm from entering the cervix and uterus. They can come as a cream, jelly, suppository, foam, or tablet. A spermicide should be inserted into the vagina with an applicator at least 82-50 minutes before sex to allow time for it to work. The process must be repeated every time  you have sex. Spermicides do not require a prescription. Intrauterine contraception Intrauterine device (IUD) An IUD is a T-shaped device that is put in a woman's uterus. There are two types: Hormone IUD.This type contains progestin, a synthetic form of the hormone progesterone. This type can stay in place for 3-5 years. Copper IUD.This type is wrapped in copper wire. It can stay in place for 10 years. Permanent methods of contraception Female tubal ligation In this method, a woman's fallopian tubes are sealed, tied, or blocked during surgery to prevent eggs from traveling to the uterus. Hysteroscopic sterilization In this method, a small, flexible insert is placed into each fallopian tube. The inserts cause scar tissue to form in the fallopian tubes and block them, so sperm cannot reach an egg. The procedure takes about 3  months to be effective. Another form of birth control must be used during those 3 months. Female sterilization This is a procedure to tie off the tubes that carry sperm (vasectomy). After the procedure, the man can still ejaculate fluid (semen). Another form of birth control must be used for 3 months after the procedure. Natural planning methods Natural family planning In this method, a couple does not have sex on days when the woman could become pregnant. Calendar method In this method, the woman keeps track of the length of each menstrual cycle, identifies the days when pregnancy can happen, and does not have sex on those days. Ovulation method In this method, a couple avoids sex during ovulation. Symptothermal method This method involves not having sex during ovulation. The woman typically checks for ovulation by watching changes in her temperature and in the consistency of cervical mucus. Post-ovulation method In this method, a couple waits to have sex until after ovulation. Where to find more information Centers for Disease Control and Prevention: http://www.wolf.info/ Summary Contraception, also called birth control, refers to methods or devices that prevent pregnancy. Hormonal methods of contraception include implants, injections, pills, patches, vaginal rings, and emergency contraceptives. Barrier methods of contraception can include female condoms, female condoms, diaphragms, cervical caps, sponges, and spermicides. There are two types of IUDs (intrauterine devices). An IUD can be put in a woman's uterus to prevent pregnancy for 3-5 years. Permanent sterilization can be done through a procedure for males and females. Natural family planning methods involve nothaving sex on days when the woman could become pregnant. This information is not intended to replace advice given to you by your health care provider. Make sure you discuss any questions you have with your health care provider. Document  Revised: 07/11/2019 Document Reviewed: 07/11/2019 Elsevier Patient Education  [place].

## 2020-12-18 NOTE — Progress Notes (Signed)
Mom-Baby Dyad New OB Visit   Subjective:   Danielle Perkins is a 25 y.o. G2P0010 at [redacted]w[redacted]d by LMP, consistent with 7wk [place], being seen today for her first obstetrical visit.  Her obstetrical history is significant for  Rh neg . Patient does intend to breast feed. Pregnancy history fully reviewed.  Patient reports no complaints.  HISTORY: OB History  Gravida Para Term Preterm AB Living  2 0 0 0 1 0  SAB IAB Ectopic Multiple Live Births  1 0 0 0 0    # Outcome Date GA Lbr Len/2nd Weight Sex Delivery Anes PTL Lv  2 Current           1 SAB 2020             Last pap smear: No results found for: DIAGPAP, HPV, HPVHIGH Collected today  Past Medical History:  Diagnosis Date   Anemia    Anxiety    Past Surgical History:  Procedure Laterality Date   NO PAST SURGERIES     Family History  Problem Relation Age of Onset   Breast cancer Maternal Grandmother        died at 63 years old   Diabetes type II Paternal Grandmother    Social History   Tobacco Use   Smoking status: Never   Smokeless tobacco: Never  Vaping Use   Vaping Use: Never used  Substance Use Topics   Alcohol use: Not Currently    Comment: 3-4, usually wine, liquor occasionally   Drug use: Never   No Known Allergies Current Outpatient Medications on File Prior to Visit  Medication Sig Dispense Refill   Doxylamine-Pyridoxine (DICLEGIS) 10-10 MG TBEC Take 2 tablets by mouth at bedtime as needed. May add 1 tablet at breakfast and 1 tablet at lunch if needed. 100 tablet 2   escitalopram (LEXAPRO) 10 MG tablet Take 10 mg by mouth daily.     famotidine (PEPCID) 20 MG tablet Take 1 tablet (20 mg total) by mouth daily. 30 tablet 1   ferrous sulfate 325 (65 FE) MG tablet Take 325 mg by mouth daily with breakfast.     metoCLOPramide (REGLAN) 10 MG tablet Take 1 tablet (10 mg total) by mouth 2 (two) times daily as needed (Nausea or headache). 30 tablet 0   ondansetron (ZOFRAN ODT) 4 MG disintegrating tablet Take 1  tablet (4 mg total) by mouth every 8 (eight) hours as needed for nausea or vomiting. 90 tablet 1   Prenatal Vit-Fe Fumarate-FA (MULTIVITAMIN-PRENATAL) 27-0.8 MG TABS tablet Take 1 tablet by mouth daily at 12 noon.     No current facility-administered medications on file prior to visit.     Exam   Vitals:   12/18/20 0833  BP: 114/70  Pulse: (!) 103  Weight: 205 lb 14.4 oz (93.4 kg)  Height: 5\' 3"  (1.6 m)   Fetal Heart Rate (bpm): 150  Uterus:     Pelvic Exam: Perineum: no hemorrhoids, normal perineum   Vulva: normal external genitalia, no lesions   Vagina:  normal mucosa mild amount of white discharge but denies any symptoms   Cervix: no lesions and normal, pap smear done.   System: General: well-developed, well-nourished female in no acute distress   Skin: normal coloration and turgor, no rashes   Neurologic: oriented, normal, negative, normal mood   Extremities: normal strength, tone, and muscle mass, ROM of all joints is normal   HEENT PERRLA, extraocular movement intact and sclera clear, anicteric  Neck supple and no masses   Respiratory:  no respiratory distress      Assessment:   Pregnancy: G2P0010 Patient Active Problem List   Diagnosis Date Noted   Rh negative state in antepartum period 12/18/2020   Supervision of low-risk pregnancy, unspecified trimester 11/20/2020   Neoplasm of uncertain behavior of skin 11/20/2020   Anxiety 11/20/2020     Plan:  1. Supervision of low-risk pregnancy, unspecified trimester Initial labs drawn. Continue prenatal vitamins. Genetic Screening discussed, NIPS: results reviewed. Ultrasound discussed; fetal anatomic survey: ordered. Problem list reviewed and updated. The nature of [facility]/Family Care clinic was explained to patient; Voiced they may need to be seen by other [facility] providers which includes family medicine physicians, OB GYNs, and APPs. Delivery will hopefully be with one of the Dyad providers or another [facility]  physician and we cannot promise this at this time.  Discussed there are [facility] staff in the hospital 24-7 and they understand and support this model and there is a likelihood one of these providers will catch their baby.  We also discussed that the service includes learners (residents, student) and they will be involved in the care team.   2. Anxiety Stable on Lexapro 10 mg  3. Rh negative state in antepartum period Antibody screen negative Rhogam at 28 weeks  Routine obstetric precautions reviewed. Return in 4 weeks (on 01/15/2021).

## 2020-12-20 LAB — CYTOLOGY - PAP: Diagnosis: NEGATIVE

## 2021-01-15 ENCOUNTER — Other Ambulatory Visit: Payer: Self-pay

## 2021-01-15 ENCOUNTER — Ambulatory Visit (INDEPENDENT_AMBULATORY_CARE_PROVIDER_SITE_OTHER): Payer: Medicaid Other | Admitting: Family Medicine

## 2021-01-15 VITALS — BP 110/79 | HR 94 | Wt 206.2 lb

## 2021-01-15 DIAGNOSIS — Z3490 Encounter for supervision of normal pregnancy, unspecified, unspecified trimester: Secondary | ICD-10-CM

## 2021-01-15 DIAGNOSIS — F419 Anxiety disorder, unspecified: Secondary | ICD-10-CM

## 2021-01-15 DIAGNOSIS — Z6791 Unspecified blood type, Rh negative: Secondary | ICD-10-CM

## 2021-01-15 DIAGNOSIS — O26899 Other specified pregnancy related conditions, unspecified trimester: Secondary | ICD-10-CM

## 2021-01-15 NOTE — Progress Notes (Signed)
   Subjective:  Danielle Perkins is a 25 y.o. G2P0010 at [redacted]w[redacted]d being seen today for ongoing prenatal care.  She is currently monitored for the following issues for this low-risk pregnancy and has Supervision of low-risk pregnancy, unspecified trimester; Neoplasm of uncertain behavior of skin; Anxiety; and Rh negative state in antepartum period on their problem list.  Patient reports no complaints.  Contractions: Not present. Vag. Bleeding: None.  Movement: Present. Denies leaking of fluid.   The following portions of the patient's history were reviewed and updated as appropriate: allergies, current medications, past family history, past medical history, past social history, past surgical history and problem list. Problem list updated.  Objective:   Vitals:   01/15/21 1610  BP: 110/79  Pulse: 94  Weight: 206 lb 3.2 oz (93.5 kg)    Fetal Status: Fetal Heart Rate (bpm): 145   Movement: Present     General:  Alert, oriented and cooperative. Patient is in no acute distress.  Skin: Skin is warm and dry. No rash noted.   Cardiovascular: Normal heart rate noted  Respiratory: Normal respiratory effort, no problems with respiration noted  Abdomen: Soft, gravid, appropriate for gestational age. Pain/Pressure: Present     Pelvic: Vag. Bleeding: None     Cervical exam deferred        Extremities: Normal range of motion.  Edema: None  Mental Status: Normal mood and affect. Normal behavior. Normal judgment and thought content.   Urinalysis:      Assessment and Plan:  Pregnancy: G2P0010 at [redacted]w[redacted]d  1. Supervision of low-risk pregnancy, unspecified trimester BP and FHR normal AFP and A1c today  2. Rh negative state in antepartum period Rhogam at 28 weeks  3. Anxiety Stable on lexapro  Preterm labor symptoms and general obstetric precautions including but not limited to vaginal bleeding, contractions, leaking of fluid and fetal movement were reviewed in detail with the patient. Please refer  to After Visit Summary for other counseling recommendations.  Return in 4 weeks (on 02/12/2021) for Dyad patient, ob visit.   Clarnce Flock, MD

## 2021-01-15 NOTE — Patient Instructions (Signed)

## 2021-01-17 ENCOUNTER — Other Ambulatory Visit: Payer: Self-pay

## 2021-01-17 ENCOUNTER — Other Ambulatory Visit: Payer: Self-pay | Admitting: Family Medicine

## 2021-01-17 ENCOUNTER — Ambulatory Visit: Payer: Medicaid Other | Attending: Family Medicine

## 2021-01-17 DIAGNOSIS — Z3490 Encounter for supervision of normal pregnancy, unspecified, unspecified trimester: Secondary | ICD-10-CM

## 2021-01-17 DIAGNOSIS — O99212 Obesity complicating pregnancy, second trimester: Secondary | ICD-10-CM | POA: Insufficient documentation

## 2021-01-17 DIAGNOSIS — Z3A19 19 weeks gestation of pregnancy: Secondary | ICD-10-CM | POA: Insufficient documentation

## 2021-01-17 DIAGNOSIS — Z363 Encounter for antenatal screening for malformations: Secondary | ICD-10-CM | POA: Insufficient documentation

## 2021-01-17 LAB — AFP, SERUM, OPEN SPINA BIFIDA
AFP MoM: 1.07
AFP Value: 44.1 ng/mL
Gest. Age on Collection Date: 18.7 weeks
Maternal Age At EDD: 25.3 yr
OSBR Risk 1 IN: 9862
Test Results:: NEGATIVE
Weight: 206 [lb_av]

## 2021-01-17 LAB — HEMOGLOBIN A1C
Est. average glucose Bld gHb Est-mCnc: 94 mg/dL
Hgb A1c MFr Bld: 4.9 % (ref 4.8–5.6)

## 2021-01-17 IMAGING — US US MFM OB DETAIL+14 WK
1 series · 13 of 28 positions shown · non-contrast
Comparison: none

[Series 1: us mfm ob detail+14 wk · 13 of 86 slices shown]
[im 4/86]
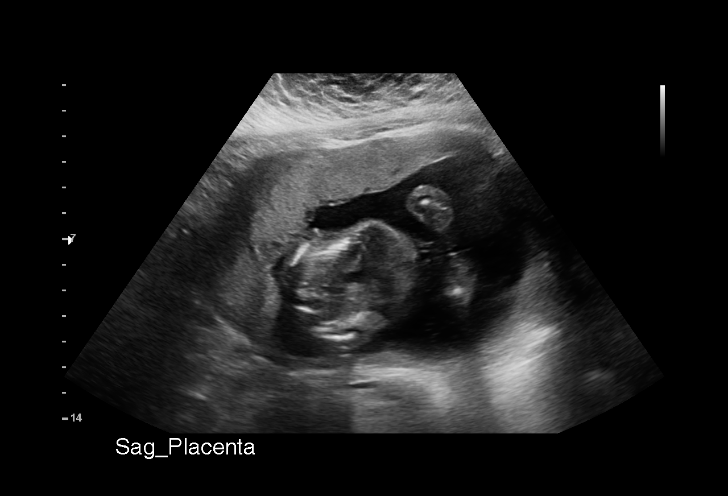
[im 10/86]
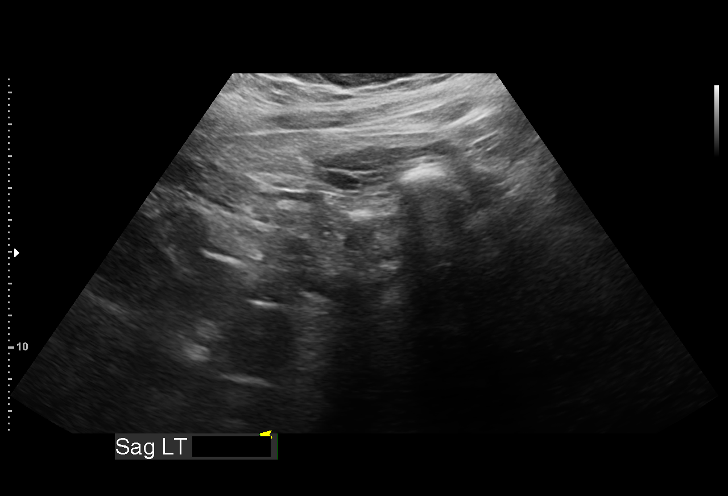
[im 16/86]
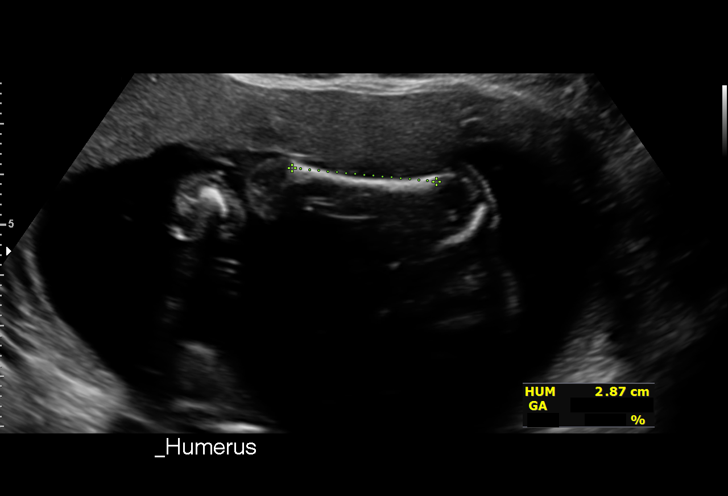
[im 23/86]
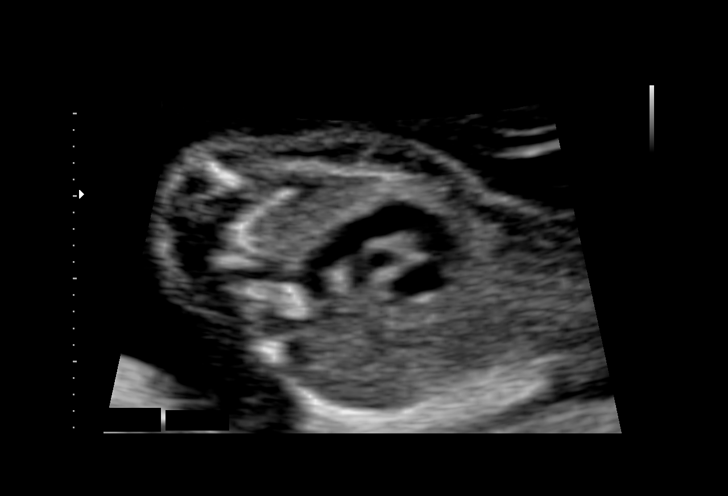
[im 29/86]
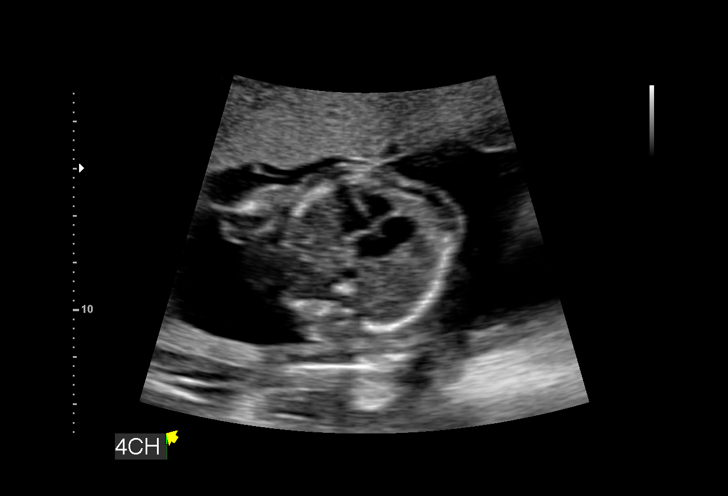
[im 35/86]
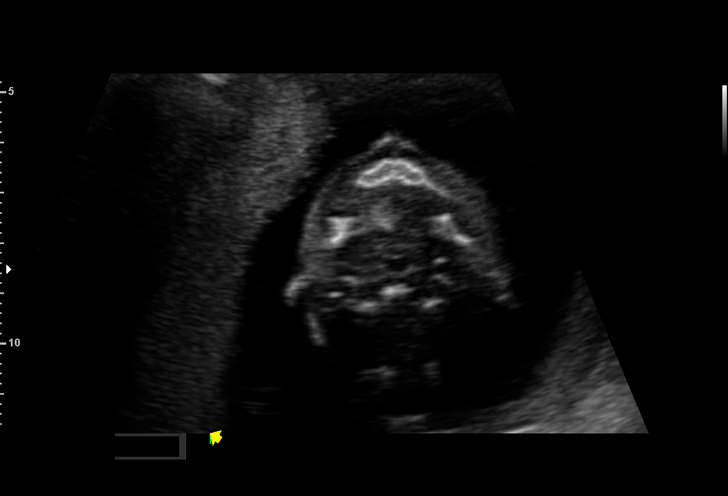
[im 45/86]
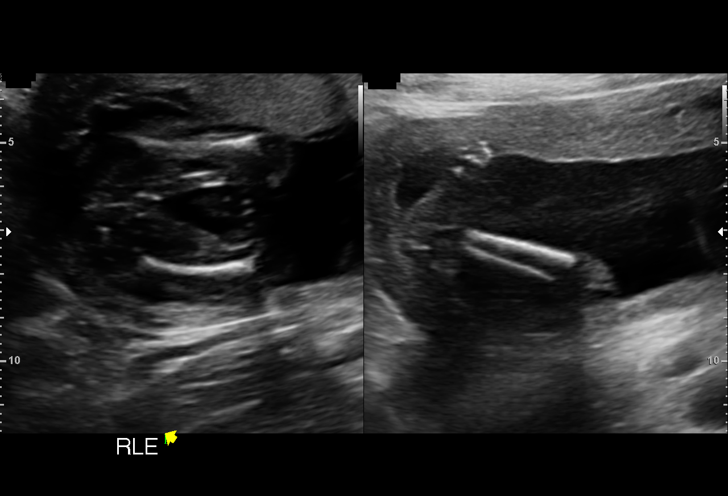
[im 51/86]
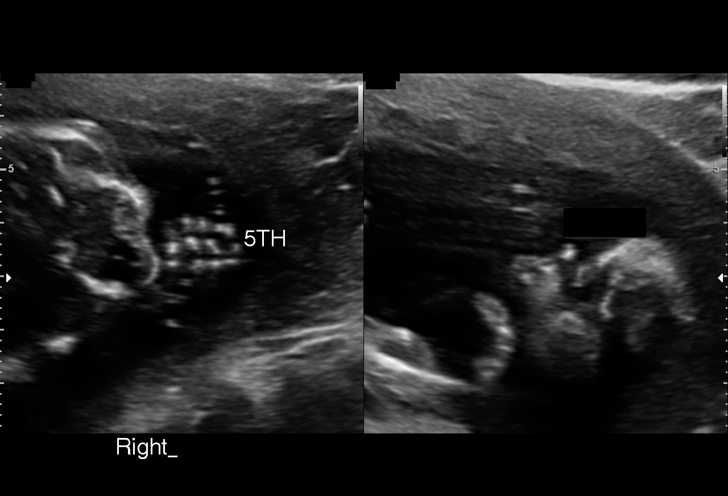
[im 57/86]
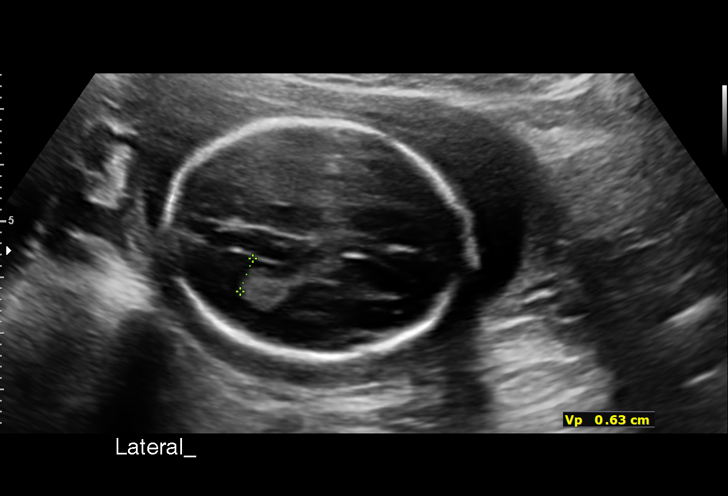
[im 63/86]
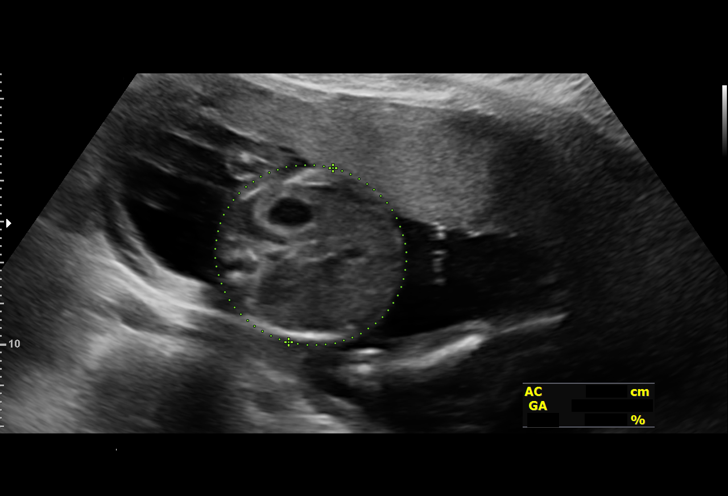
[im 70/86]
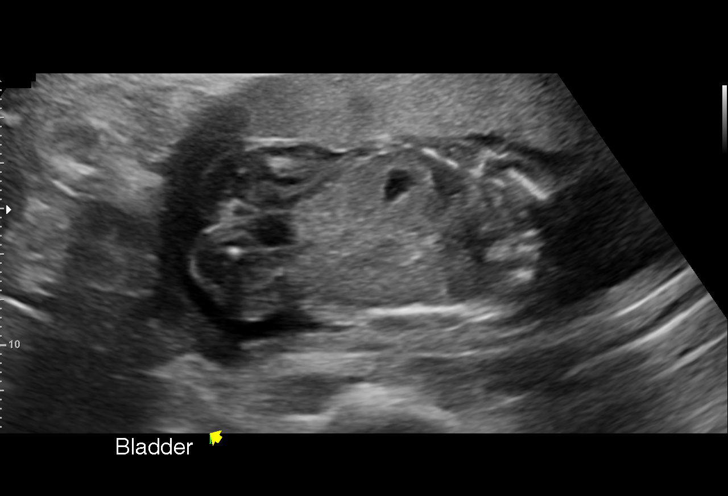
[im 76/86]
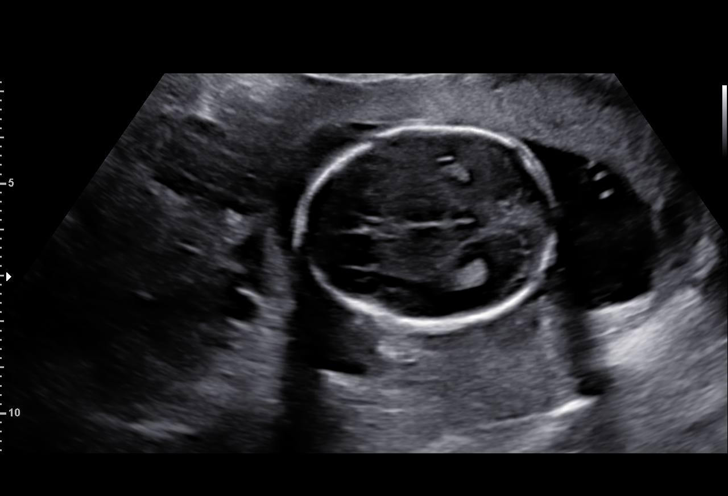
[im 82/86]
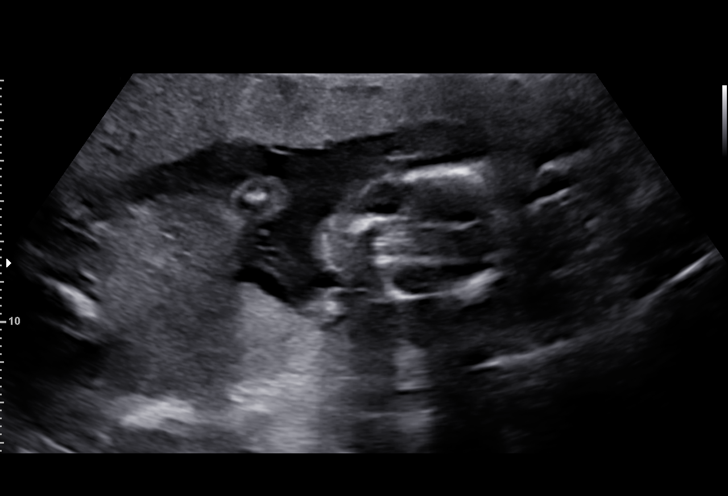

[13 of 28 positions shown; findings below may reference images not displayed]

Indications

 Obesity complicating pregnancy, second
 trimester
 19 weeks gestation of pregnancy
 Encounter for antenatal screening for
 malformations
 LR NIPS/ Horizon/ Negative AFP
Fetal Evaluation

 Num Of Fetuses:         1
 Fetal Heart Rate(bpm):  155
 Cardiac Activity:       Observed
 Presentation:           Variable
 Placenta:               Anterior
 P. Cord Insertion:      Visualized, central

 Amniotic Fluid
 AFI FV:      Within normal limits

                             Largest Pocket(cm)

Biometry

 BPD:      43.6  mm     G. Age:  19w 1d         59  %    CI:        76.02   %    70 - 86
                                                         FL/HC:      17.7   %    16.1 -
 HC:      158.5  mm     G. Age:  18w 5d         29  %    HC/AC:      1.11        1.09 -
 AC:      142.9  mm     G. Age:  19w 4d         67  %    FL/BPD:     64.4   %
 FL:       28.1  mm     G. Age:  18w 4d         29  %    FL/AC:      19.7   %    20 - 24
 HUM:      27.6  mm     G. Age:  18w 6d         46  %
 CER:      18.8  mm     G. Age:  18w 3d         13  %
 NFT:       4.3  mm

 LV:        6.3  mm
 CM:        4.7  mm

 Est. FW:     275  gm    0 lb 10 oz      53  %
OB History

 Gravidity:    2          SAB:   1
Gestational Age

 LMP:           19w 0d        Date:  09/06/20                 EDD:   06/13/21
 U/S Today:     19w 0d                                        EDD:   06/13/21
 Best:          19w 0d     Det. By:  LMP  (09/06/20)          EDD:   06/13/21
Anatomy

 Cranium:               Appears normal         LVOT:                   Appears normal
 Cavum:                 Appears normal         Aortic Arch:            Appears normal
 Ventricles:            Appears normal         Ductal Arch:            Appears normal
 Choroid Plexus:        Appears normal         Diaphragm:              Appears normal
 Cerebellum:            Appears normal         Stomach:                Appears normal, left
                                                                       sided
 Posterior Fossa:       Appears normal         Abdomen:                Appears normal
 Nuchal Fold:           Appears normal         Abdominal Wall:         Appears nml (cord
                                                                       insert, abd wall)
 Face:                  Appears normal         Cord Vessels:           Appears normal (3
                        (orbits and profile)                           vessel cord)
 Lips:                  Appears normal         Kidneys:                Appear normal
 Palate:                Appears normal         Bladder:                Appears normal
 Thoracic:              Appears normal         Spine:                  Appears normal
 Heart:                 Appears normal         Upper Extremities:      Appears normal
                        (4CH, axis, and
                        situs)
 RVOT:                  Appears normal         Lower Extremities:      Appears normal

 Other:  Fetus appears to be female. Heels/feet, open hands/5th digits, nasal
         bone, lenses, VC, 3VV and 3VTV visualized. Technically difficult due
         to fetal position.
Cervix Uterus Adnexa

 Cervix
 Length:           4.79  cm.
 Normal appearance by transabdominal scan.

 Uterus
 No abnormality visualized.

 Right Ovary
 Not visualized.

 Left Ovary
 Not visualized.
 Adnexa
 No adnexal mass visualized.
Comments

 This patient was seen for a detailed fetal anatomy scan due
 to maternal obesity.
 She denies any significant past medical history and denies
 any problems in her current pregnancy.
 She had a cell free DNA test earlier in her pregnancy which
 indicated a low risk for trisomy 21, 18, and 13. A female fetus
 is predicted.
 She was informed that the fetal growth and amniotic fluid
 level were appropriate for her gestational age.
 There were no obvious fetal anomalies noted on today's
 ultrasound exam.
 The patient was informed that anomalies may be missed due
 to technical limitations. If the fetus is in a suboptimal position
 or maternal habitus is increased, visualization of the fetus in
 the maternal uterus may be impaired.
 Follow up as indicated.

## 2021-01-30 ENCOUNTER — Encounter: Payer: Self-pay | Admitting: Family Medicine

## 2021-02-12 ENCOUNTER — Telehealth: Payer: Self-pay | Admitting: Family Medicine

## 2021-02-12 NOTE — Telephone Encounter (Signed)
Patient called stating she had to leave work this morning due to her blood pressure bottoming out, feeling clammy, and having spotty vision. Patient has an appointment tomorrow but wanted to speak with a nurse to see if she needed to be seen sooner.

## 2021-02-12 NOTE — Telephone Encounter (Signed)
Pt called this morning to office after having near syncope episode at work. Pt states having her hands got clammy, spotty vision and low BP. Pt took BP at home that was 120/80s. Pulse 106. Pt states ate breakfast this morning around 645 am that was oatmeal with fruit then this episode happened around 8am. Pt states baby is moving well and denies any vaginal bleeding, spotting or discharge. Pt did not pass out. Pt states sat down in chair when she started feeling this way. Pt had last CBC of 11.9 and is taking PNV with Fe and taking Fe supplement daily.  Reviewed call with Dr Dione Plover. Pt advised to hydrate and eat healthy and move slowing when going from sitting to standing position. Pt has OB appt tomorrow and advised to keep appt. Pt agreeable to plan of care.  Colletta Maryland, RN

## 2021-02-13 ENCOUNTER — Other Ambulatory Visit: Payer: Self-pay

## 2021-02-13 ENCOUNTER — Ambulatory Visit (INDEPENDENT_AMBULATORY_CARE_PROVIDER_SITE_OTHER): Payer: Medicaid Other | Admitting: Family Medicine

## 2021-02-13 VITALS — BP 133/74 | HR 118 | Wt 207.2 lb

## 2021-02-13 DIAGNOSIS — O26899 Other specified pregnancy related conditions, unspecified trimester: Secondary | ICD-10-CM

## 2021-02-13 DIAGNOSIS — Z6791 Unspecified blood type, Rh negative: Secondary | ICD-10-CM

## 2021-02-13 DIAGNOSIS — Z3490 Encounter for supervision of normal pregnancy, unspecified, unspecified trimester: Secondary | ICD-10-CM

## 2021-02-13 DIAGNOSIS — F419 Anxiety disorder, unspecified: Secondary | ICD-10-CM

## 2021-02-13 NOTE — Progress Notes (Signed)
° °  Subjective:  Danielle Perkins is a 25 y.o. G2P0010 at [redacted]w[redacted]d being seen today for ongoing prenatal care.  She is currently monitored for the following issues for this low-risk pregnancy and has Supervision of low-risk pregnancy, unspecified trimester; Neoplasm of uncertain behavior of skin; Anxiety; and Rh negative state in antepartum period on their problem list.  Patient reports no complaints.  Contractions: Not present. Vag. Bleeding: None.  Movement: Present. Denies leaking of fluid.   The following portions of the patient's history were reviewed and updated as appropriate: allergies, current medications, past family history, past medical history, past social history, past surgical history and problem list. Problem list updated.  Objective:   Vitals:   02/13/21 1523  BP: 133/74  Pulse: (!) 118  Weight: 207 lb 3.2 oz (94 kg)    Fetal Status: Fetal Heart Rate (bpm): 156   Movement: Present     General:  Alert, oriented and cooperative. Patient is in no acute distress.  Skin: Skin is warm and dry. No rash noted.   Cardiovascular: Normal heart rate noted  Respiratory: Normal respiratory effort, no problems with respiration noted  Abdomen: Soft, gravid, appropriate for gestational age. Pain/Pressure: Present     Pelvic: Vag. Bleeding: None     Cervical exam deferred        Extremities: Normal range of motion.  Edema: None  Mental Status: Normal mood and affect. Normal behavior. Normal judgment and thought content.   Urinalysis:      Assessment and Plan:  Pregnancy: G2P0010 at [redacted]w[redacted]d  1. Supervision of low-risk pregnancy, unspecified trimester BP and FHR normal Advised of need to be fasting for labs for next visit  2. Rh negative state in antepartum period Rhogam next visit  3. Anxiety Stable on lexapro  Preterm labor symptoms and general obstetric precautions including but not limited to vaginal bleeding, contractions, leaking of fluid and fetal movement were reviewed in  detail with the patient. Please refer to After Visit Summary for other counseling recommendations.  Return in 4 weeks (on 03/13/2021) for Dyad patient, ob visit, 28 wk labs.   Clarnce Flock, MD

## 2021-02-13 NOTE — Patient Instructions (Signed)

## 2021-02-18 ENCOUNTER — Encounter (HOSPITAL_COMMUNITY): Payer: Self-pay | Admitting: Obstetrics & Gynecology

## 2021-02-18 ENCOUNTER — Other Ambulatory Visit: Payer: Self-pay

## 2021-02-18 ENCOUNTER — Inpatient Hospital Stay (HOSPITAL_COMMUNITY)
Admission: AD | Admit: 2021-02-18 | Discharge: 2021-02-18 | Disposition: A | Discharge status: Home / Self Care | Payer: Medicaid Other | Attending: Obstetrics & Gynecology | Admitting: Obstetrics & Gynecology

## 2021-02-18 DIAGNOSIS — O212 Late vomiting of pregnancy: Secondary | ICD-10-CM | POA: Insufficient documentation

## 2021-02-18 DIAGNOSIS — R112 Nausea with vomiting, unspecified: Secondary | ICD-10-CM

## 2021-02-18 DIAGNOSIS — R519 Headache, unspecified: Secondary | ICD-10-CM | POA: Diagnosis not present

## 2021-02-18 DIAGNOSIS — Z3A23 23 weeks gestation of pregnancy: Secondary | ICD-10-CM

## 2021-02-18 DIAGNOSIS — R42 Dizziness and giddiness: Secondary | ICD-10-CM | POA: Insufficient documentation

## 2021-02-18 DIAGNOSIS — O26892 Other specified pregnancy related conditions, second trimester: Secondary | ICD-10-CM | POA: Diagnosis present

## 2021-02-18 LAB — URINALYSIS, ROUTINE W REFLEX MICROSCOPIC
Bilirubin Urine: NEGATIVE
Glucose, UA: NEGATIVE mg/dL
Hgb urine dipstick: NEGATIVE
Ketones, ur: 5 mg/dL — AB
Nitrite: NEGATIVE
Protein, ur: NEGATIVE mg/dL
Specific Gravity, Urine: 1.012 (ref 1.005–1.030)
pH: 7 (ref 5.0–8.0)

## 2021-02-18 LAB — CBC WITH DIFFERENTIAL/PLATELET
Abs Immature Granulocytes: 0.07 10*3/uL (ref 0.00–0.07)
Basophils Absolute: 0 10*3/uL (ref 0.0–0.1)
Basophils Relative: 0 %
Eosinophils Absolute: 0 10*3/uL (ref 0.0–0.5)
Eosinophils Relative: 0 %
HCT: 31.4 % — ABNORMAL LOW (ref 36.0–46.0)
Hemoglobin: 10.4 g/dL — ABNORMAL LOW (ref 12.0–15.0)
Immature Granulocytes: 1 %
Lymphocytes Relative: 4 %
Lymphs Abs: 0.5 10*3/uL — ABNORMAL LOW (ref 0.7–4.0)
MCH: 29.3 pg (ref 26.0–34.0)
MCHC: 33.1 g/dL (ref 30.0–36.0)
MCV: 88.5 fL (ref 80.0–100.0)
Monocytes Absolute: 0.4 10*3/uL (ref 0.1–1.0)
Monocytes Relative: 3 %
Neutro Abs: 12.1 10*3/uL — ABNORMAL HIGH (ref 1.7–7.7)
Neutrophils Relative %: 92 %
Platelets: 219 10*3/uL (ref 150–400)
RBC: 3.55 MIL/uL — ABNORMAL LOW (ref 3.87–5.11)
RDW: 13.8 % (ref 11.5–15.5)
WBC: 13.1 10*3/uL — ABNORMAL HIGH (ref 4.0–10.5)
nRBC: 0 % (ref 0.0–0.2)

## 2021-02-18 LAB — COMPREHENSIVE METABOLIC PANEL
ALT: 21 U/L (ref 0–44)
AST: 18 U/L (ref 15–41)
Albumin: 3 g/dL — ABNORMAL LOW (ref 3.5–5.0)
Alkaline Phosphatase: 117 U/L (ref 38–126)
Anion gap: 10 (ref 5–15)
BUN: 6 mg/dL (ref 6–20)
CO2: 18 mmol/L — ABNORMAL LOW (ref 22–32)
Calcium: 8.5 mg/dL — ABNORMAL LOW (ref 8.9–10.3)
Chloride: 103 mmol/L (ref 98–111)
Creatinine, Ser: 0.43 mg/dL — ABNORMAL LOW (ref 0.44–1.00)
GFR, Estimated: >60 mL/min (ref >60)
Glucose, Bld: 83 mg/dL (ref 70–99)
Potassium: 3.8 mmol/L (ref 3.5–5.1)
Sodium: 131 mmol/L — ABNORMAL LOW (ref 135–145)
Total Bilirubin: 0.7 mg/dL (ref 0.3–1.2)
Total Protein: 6.4 g/dL — ABNORMAL LOW (ref 6.5–8.1)

## 2021-02-18 LAB — TSH: TSH: 0.952 u[IU]/mL (ref 0.350–4.500)

## 2021-02-18 MED ORDER — M.V.I. ADULT IV INJ
Freq: Once | INTRAVENOUS | Status: AC
  Filled 2021-02-18: qty 1000

## 2021-02-18 MED ORDER — LACTATED RINGERS IV BOLUS
1000.0000 mL | Freq: Once | INTRAVENOUS | Status: AC
  Administered 2021-02-18: 1000 mL via INTRAVENOUS

## 2021-02-18 MED ORDER — ONDANSETRON HCL 8 MG PO TABS: 8.0000 mg | ORAL_TABLET | Freq: Three times a day (TID) | ORAL | 1 refills | Status: DC | PRN

## 2021-02-18 MED ORDER — SODIUM CHLORIDE 0.9 % IV SOLN
8.0000 mg | Freq: Once | INTRAVENOUS | Status: AC
  Administered 2021-02-18: 8 mg via INTRAVENOUS
  Filled 2021-02-18: qty 4

## 2021-02-18 NOTE — MAU Provider Note (Signed)
History     CSN: [id number]  Arrival date and time: 02/18/21 1655   Event Date/Time   First Provider Initiated Contact with Patient 02/18/21 1741      Chief Complaint  Patient presents with   Headache   Decreased Fetal Movement   Nausea   HPI  Ms.Danielle Perkins is a 26 y.o. female G57P0010 @[redacted]w[redacted]d   presenting  with CC of dizziness & fatigue. This is not a new problem.  Patient reports 2 episodic back to back dizziness spells today at work with headache & N/V. She continues to have morning sickness and is unable to keep most foods down. She has tried to eat granola bar and fruit and some days she is able to keep things down and other days she is not.  Pt is taking diclegis for nausea; 2 tablets at bedtime, and sporadically taking zofran. She reports a weight loss of 20 lbs in the first trimester, however does report improvement in symptoms since then. She has gained 2 lbs in the 2nd trimester.    OB History     Gravida  2   Para  0   Term  0   Preterm  0   AB  1   Living  0      SAB  1   IAB  0   Ectopic  0   Multiple  0   Live Births  0           Past Medical History:  Diagnosis Date   Anemia    Anxiety     Past Surgical History:  Procedure Laterality Date   NO PAST SURGERIES      Family History  Problem Relation Age of Onset   Breast cancer Maternal Grandmother        died at 11 years old   Diabetes type II Paternal Grandmother     Social History   Tobacco Use   Smoking status: Never   Smokeless tobacco: Never  Vaping Use   Vaping Use: Never used  Substance Use Topics   Alcohol use: Not Currently    Comment: 3-4, usually wine, liquor occasionally   Drug use: Never    Allergies: No Known Allergies  Medications Prior to Admission  Medication Sig Dispense Refill Last Dose   Doxylamine-Pyridoxine (DICLEGIS) 10-10 MG TBEC Take 2 tablets by mouth at bedtime as needed. May add 1 tablet at breakfast and 1 tablet at lunch if needed. 100  tablet 2 Past Week   escitalopram (LEXAPRO) 10 MG tablet Take 1 tablet by mouth daily.   02/18/2021   famotidine (PEPCID) 20 MG tablet Take 1 tablet (20 mg total) by mouth daily. 30 tablet 1 02/18/2021   ferrous sulfate 325 (65 FE) MG tablet Take 325 mg by mouth daily with breakfast.   02/18/2021   metoCLOPramide (REGLAN) 10 MG tablet Take 1 tablet (10 mg total) by mouth 2 (two) times daily as needed (Nausea or headache). 30 tablet 0 Past Month   Prenatal Vit-Fe Fumarate-FA (MULTIVITAMIN-PRENATAL) 27-0.8 MG TABS tablet Take 1 tablet by mouth daily at 12 noon.   02/18/2021   Results for orders placed or performed during the hospital encounter of 02/18/21 (from the past 48 hour(s))  Urinalysis, Routine w reflex microscopic Urine, Clean Catch     Status: Abnormal   Collection Time: 02/18/21  5:58 PM  Result Value Ref Range   Color, Urine YELLOW YELLOW   APPearance HAZY (A) CLEAR   Specific Gravity, Urine  1.012 1.005 - 1.030   pH 7.0 5.0 - 8.0   Glucose, UA NEGATIVE NEGATIVE mg/dL   Hgb urine dipstick NEGATIVE NEGATIVE   Bilirubin Urine NEGATIVE NEGATIVE   Ketones, ur 5 (A) NEGATIVE mg/dL   Protein, ur NEGATIVE NEGATIVE mg/dL   Nitrite NEGATIVE NEGATIVE   Leukocytes,Ua SMALL (A) NEGATIVE   RBC / HPF 0-5 0 - 5 RBC/hpf   WBC, UA 6-10 0 - 5 WBC/hpf   Bacteria, UA RARE (A) NONE SEEN   Squamous Epithelial / LPF 6-10 0 - 5   Mucus PRESENT     Comment: Performed at [facility], [place] [address], [place], [place] [address]  Comprehensive metabolic panel     Status: Abnormal (Preliminary result)   Collection Time: 02/18/21  6:15 PM  Result Value Ref Range   Sodium 131 (L) 135 - 145 mmol/L   Potassium 3.8 3.5 - 5.1 mmol/L   Chloride 103 98 - 111 mmol/L   CO2 18 (L) 22 - 32 mmol/L   Glucose, Bld 83 70 - 99 mg/dL    Comment: Glucose reference range applies only to samples taken after fasting for at least 8 hours.   BUN 6 6 - 20 mg/dL   Creatinine, Ser PENDING 0.44 - 1.00 mg/dL   Calcium 8.5 (L)  8.9 - 10.3 mg/dL   Total Protein 6.4 (L) 6.5 - 8.1 g/dL   Albumin 3.0 (L) 3.5 - 5.0 g/dL   AST 18 15 - 41 U/L   ALT 21 0 - 44 U/L   Alkaline Phosphatase 117 38 - 126 U/L   Total Bilirubin 0.7 0.3 - 1.2 mg/dL   GFR, Estimated PENDING >60 mL/min   Anion gap 10 5 - 15    Comment: Performed at [facility], [place] [address] [place], [place] [address]  CBC with Differential/Platelet     Status: Abnormal   Collection Time: 02/18/21  6:15 PM  Result Value Ref Range   WBC 13.1 (H) 4.0 - 10.5 K/uL   RBC 3.55 (L) 3.87 - 5.11 MIL/uL   Hemoglobin 10.4 (L) 12.0 - 15.0 g/dL   HCT 31.4 (L) 36.0 - 46.0 %   MCV 88.5 80.0 - 100.0 fL   MCH 29.3 26.0 - 34.0 pg   MCHC 33.1 30.0 - 36.0 g/dL   RDW 13.8 11.5 - 15.5 %   Platelets 219 150 - 400 K/uL   nRBC 0.0 0.0 - 0.2 %   Neutrophils Relative % 92 %   Neutro Abs 12.1 (H) 1.7 - 7.7 K/uL   Lymphocytes Relative 4 %   Lymphs Abs 0.5 (L) 0.7 - 4.0 K/uL   Monocytes Relative 3 %   Monocytes Absolute 0.4 0.1 - 1.0 K/uL   Eosinophils Relative 0 %   Eosinophils Absolute 0.0 0.0 - 0.5 K/uL   Basophils Relative 0 %   Basophils Absolute 0.0 0.0 - 0.1 K/uL   Immature Granulocytes 1 %   Abs Immature Granulocytes 0.07 0.00 - 0.07 K/uL    Comment: Performed at [facility], [place] [address], [place], [place] [address]  TSH     Status: None   Collection Time: 02/18/21  6:15 PM  Result Value Ref Range   TSH 0.952 0.350 - 4.500 uIU/mL    Comment: Performed by a 3rd Generation assay with a functional sensitivity of <=0.01 uIU/mL. Performed at [facility], [place] [address] [place], [place] [address]     Review of Systems  Constitutional:  Negative for fever.  Respiratory:  Negative for chest tightness and shortness of breath.   Cardiovascular:  Negative for chest pain.  Gastrointestinal:  Positive for nausea and vomiting.  Genitourinary:  Negative for vaginal bleeding and vaginal discharge.  Physical Exam   Blood pressure 124/66, pulse (!) 102,  temperature 98.4 F (36.9 C), temperature source Oral, resp. rate 18, height 5\' 3"  (1.6 m), weight 94.3 kg, last menstrual period 09/06/2020, SpO2 100 %.  Physical Exam Vitals and nursing note reviewed.  Constitutional:      General: She is not in acute distress.    Appearance: She is well-developed. She is not ill-appearing or toxic-appearing.  Musculoskeletal:     Cervical back: Normal range of motion.  Neurological:     Mental Status: She is alert and oriented to person, place, and time.  Psychiatric:        Behavior: Behavior normal.   Fetal Tracing: Baseline: 145 bpm Variability: Moderate  Accelerations: 10x10 Decelerations: None Toco: None  MAU Course  Procedures  MDM  UA shows 5 of ketones  Lr x 1 MVI x 1 Zofran & Pepcid CBC & CMP, TSH + orthostatic vitals - Hr increase with standing HR now 90's after 2 liters of fluid.  Patient tolerating oral fluids and reports feeling much better.   Assessment and Plan   A:  1. Nausea and vomiting, unspecified vomiting type   2. [redacted] weeks gestation of pregnancy      P:  Discharge home Return to MAU if symptoms worsen Rx: Zofran, encouraged to take this Increase diclegis Small frequent meals, encouraged high protein snacks  Dechelle Attaway, Artist Pais, NP 02/20/2021 4:03 PM

## 2021-02-18 NOTE — MAU Note (Signed)
Danielle Perkins is a 26 y.o. at [redacted]w[redacted]d here in MAU reporting: for the past few weeks has had near syncopal episodes. Today at work she had 2 of those episodes. Has a headache since then. Has had a few episodes of vomiting since then. DFM.   Onset of complaint: ongoing  Pain score: 7/10  Vitals:   02/18/21 1708  BP: 119/80  Pulse: (!) 116  Resp: 16  Temp: 98.2 F (36.8 C)  SpO2: 99%     FHT:158  Lab orders placed from triage: UA

## 2021-03-11 ENCOUNTER — Other Ambulatory Visit: Payer: Self-pay

## 2021-03-11 DIAGNOSIS — Z3490 Encounter for supervision of normal pregnancy, unspecified, unspecified trimester: Secondary | ICD-10-CM

## 2021-03-13 ENCOUNTER — Ambulatory Visit (INDEPENDENT_AMBULATORY_CARE_PROVIDER_SITE_OTHER): Payer: Medicaid Other | Admitting: Family Medicine

## 2021-03-13 ENCOUNTER — Other Ambulatory Visit: Payer: Medicaid Other

## 2021-03-13 ENCOUNTER — Other Ambulatory Visit: Payer: Self-pay

## 2021-03-13 VITALS — BP 110/80 | HR 121 | Wt 209.6 lb

## 2021-03-13 DIAGNOSIS — O26899 Other specified pregnancy related conditions, unspecified trimester: Secondary | ICD-10-CM

## 2021-03-13 DIAGNOSIS — O99619 Diseases of the digestive system complicating pregnancy, unspecified trimester: Secondary | ICD-10-CM

## 2021-03-13 DIAGNOSIS — Z23 Encounter for immunization: Secondary | ICD-10-CM

## 2021-03-13 DIAGNOSIS — Z3490 Encounter for supervision of normal pregnancy, unspecified, unspecified trimester: Secondary | ICD-10-CM

## 2021-03-13 DIAGNOSIS — Z6791 Unspecified blood type, Rh negative: Secondary | ICD-10-CM | POA: Diagnosis not present

## 2021-03-13 DIAGNOSIS — F419 Anxiety disorder, unspecified: Secondary | ICD-10-CM

## 2021-03-13 DIAGNOSIS — K219 Gastro-esophageal reflux disease without esophagitis: Secondary | ICD-10-CM

## 2021-03-13 MED ORDER — RHO D IMMUNE GLOBULIN 1500 UNIT/2ML IJ SOSY
300.0000 ug | PREFILLED_SYRINGE | Freq: Once | INTRAMUSCULAR | Status: AC
  Administered 2021-03-13: 12:00:00 300 ug via INTRAMUSCULAR

## 2021-03-13 MED ORDER — OMEPRAZOLE 20 MG PO CPDR: 20.0000 mg | DELAYED_RELEASE_CAPSULE | Freq: Every day | ORAL | 2 refills | Status: DC

## 2021-03-13 NOTE — Progress Notes (Signed)
° °  Subjective:  Danielle Perkins is a 26 y.o. G2P0010 at [redacted]w[redacted]d being seen today for ongoing prenatal care.  She is currently monitored for the following issues for this low-risk pregnancy and has Supervision of low-risk pregnancy, unspecified trimester; Neoplasm of uncertain behavior of skin; Anxiety; and Rh negative state in antepartum period on their problem list.  Patient reports no complaints.  Contractions: Not present. Vag. Bleeding: None.  Movement: Present. Denies leaking of fluid.   The following portions of the patient's history were reviewed and updated as appropriate: allergies, current medications, past family history, past medical history, past social history, past surgical history and problem list. Problem list updated.  Objective:   Vitals:   03/13/21 0838  BP: 110/80  Pulse: (!) 121  Weight: 209 lb 9.6 oz (95.1 kg)    Fetal Status: Fetal Heart Rate (bpm): 144   Movement: Present     General:  Alert, oriented and cooperative. Patient is in no acute distress.  Skin: Skin is warm and dry. No rash noted.   Cardiovascular: Normal heart rate noted  Respiratory: Normal respiratory effort, no problems with respiration noted  Abdomen: Soft, gravid, appropriate for gestational age. Pain/Pressure: Present     Pelvic: Vag. Bleeding: None     Cervical exam deferred        Extremities: Normal range of motion.  Edema: None  Mental Status: Normal mood and affect. Normal behavior. Normal judgment and thought content.   Urinalysis:      Assessment and Plan:  Pregnancy: G2P0010 at [redacted]w[redacted]d  1. Supervision of low-risk pregnancy, unspecified trimester BP and FHR normal Third trimester labs today TDaP and Rhogam today Trial omperazole for GERD  2. Anxiety Stable on lexapro  3. Rh negative state in antepartum period Given rhogam today  Preterm labor symptoms and general obstetric precautions including but not limited to vaginal bleeding, contractions, leaking of fluid and fetal  movement were reviewed in detail with the patient. Please refer to After Visit Summary for other counseling recommendations.  Return in about 2 weeks (around 03/27/2021) for Dyad patient, ob visit.   Clarnce Flock, MD

## 2021-03-14 LAB — RPR: RPR Ser Ql: NONREACTIVE

## 2021-03-14 LAB — GLUCOSE TOLERANCE, 2 HOURS W/ 1HR
Glucose, 1 hour: 93 mg/dL (ref 70–179)
Glucose, 2 hour: 101 mg/dL (ref 70–152)
Glucose, Fasting: 75 mg/dL (ref 70–91)

## 2021-03-14 LAB — CBC
Hematocrit: 30.6 % — ABNORMAL LOW (ref 34.0–46.6)
Hemoglobin: 10.1 g/dL — ABNORMAL LOW (ref 11.1–15.9)
MCH: 28.8 pg (ref 26.6–33.0)
MCHC: 33 g/dL (ref 31.5–35.7)
MCV: 87 fL (ref 79–97)
Platelets: 258 10*3/uL (ref 150–450)
RBC: 3.51 x10E6/uL — ABNORMAL LOW (ref 3.77–5.28)
RDW: 13 % (ref 11.7–15.4)
WBC: 7.6 10*3/uL (ref 3.4–10.8)

## 2021-03-14 LAB — ANTIBODY SCREEN: Antibody Screen: NEGATIVE

## 2021-03-14 LAB — HIV ANTIBODY (ROUTINE TESTING W REFLEX): HIV Screen 4th Generation wRfx: NONREACTIVE

## 2021-03-27 ENCOUNTER — Other Ambulatory Visit: Payer: Self-pay

## 2021-03-27 ENCOUNTER — Ambulatory Visit (INDEPENDENT_AMBULATORY_CARE_PROVIDER_SITE_OTHER): Payer: Medicaid Other | Admitting: Family Medicine

## 2021-03-27 VITALS — BP 122/81 | HR 79 | Wt 211.3 lb

## 2021-03-27 DIAGNOSIS — Z3490 Encounter for supervision of normal pregnancy, unspecified, unspecified trimester: Secondary | ICD-10-CM

## 2021-03-27 NOTE — Progress Notes (Signed)
° ° °  PRENATAL VISIT NOTE  Subjective:  Danielle Perkins is a 26 y.o. G2P0010 at [redacted]w[redacted]d being seen today for ongoing prenatal care.  She is currently monitored for the following issues for this low-risk pregnancy and has Supervision of low-risk pregnancy, unspecified trimester; Neoplasm of uncertain behavior of skin; Anxiety; and Rh negative state in antepartum period on their problem list.  Patient reports no complaints.  Contractions: Irritability. Vag. Bleeding: None.  Movement: Present. Denies leaking of fluid.   The following portions of the patient's history were reviewed and updated as appropriate: allergies, current medications, past family history, past medical history, past social history, past surgical history and problem list.   Objective:   Vitals:   03/27/21 1418  BP: 122/81  Pulse: 79  Weight: 211 lb 4.8 oz (95.8 kg)    Fetal Status: Fetal Heart Rate (bpm): 147   Movement: Present     General:  Alert, oriented and cooperative. Patient is in no acute distress.  Skin: Skin is warm and dry. No rash noted.   Cardiovascular: Normal heart rate noted  Respiratory: Normal respiratory effort, no problems with respiration noted  Abdomen: Soft, gravid, appropriate for gestational age.  Pain/Pressure: Present     Pelvic: Cervical exam deferred        Extremities: Normal range of motion.  Edema: None  Mental Status: Normal mood and affect. Normal behavior. Normal judgment and thought content.   Assessment and Plan:  Pregnancy: G2P0010 at [redacted]w[redacted]d  1. Supervision of low-risk pregnancy, unspecified trimester 28 wk labs WNL Limit lifting to <25- given letter for her employer Reviewed normal discomforts of pregnancy Recommended starting to think about postpartum planning/labor planning etc.    Preterm labor symptoms and general obstetric precautions including but not limited to vaginal bleeding, contractions, leaking of fluid and fetal movement were reviewed in detail with the  patient. Please refer to After Visit Summary for other counseling recommendations.   Return for scheduled visit, [facility].  Future Appointments  Date Time Provider [place]  04/10/2021  2:35 PM [facility]  04/23/2021 10:55 AM [facility]  05/09/2021  9:55 AM [facility]  05/14/2021  2:15 PM [facility]    Caren Macadam, MD

## 2021-04-10 ENCOUNTER — Other Ambulatory Visit: Payer: Self-pay

## 2021-04-10 ENCOUNTER — Ambulatory Visit (INDEPENDENT_AMBULATORY_CARE_PROVIDER_SITE_OTHER): Payer: Medicaid Other | Admitting: Family Medicine

## 2021-04-10 VITALS — BP 126/81 | HR 107 | Wt 212.0 lb

## 2021-04-10 DIAGNOSIS — Z3490 Encounter for supervision of normal pregnancy, unspecified, unspecified trimester: Secondary | ICD-10-CM

## 2021-04-10 DIAGNOSIS — O26899 Other specified pregnancy related conditions, unspecified trimester: Secondary | ICD-10-CM

## 2021-04-10 DIAGNOSIS — F419 Anxiety disorder, unspecified: Secondary | ICD-10-CM

## 2021-04-10 DIAGNOSIS — Z6791 Unspecified blood type, Rh negative: Secondary | ICD-10-CM

## 2021-04-10 NOTE — Progress Notes (Signed)
° °  Subjective:  Danielle Perkins is a 26 y.o. G2P0010 at [redacted]w[redacted]d being seen today for ongoing prenatal care.  She is currently monitored for the following issues for this low-risk pregnancy and has Supervision of low-risk pregnancy, unspecified trimester; Neoplasm of uncertain behavior of skin; Anxiety; and Rh negative state in antepartum period on their problem list.  Patient reports fatigue.  Contractions: Irritability. Vag. Bleeding: None.  Movement: Present. Denies leaking of fluid.   The following portions of the patient's history were reviewed and updated as appropriate: allergies, current medications, past family history, past medical history, past social history, past surgical history and problem list. Problem list updated.  Objective:   Vitals:   04/10/21 1454  BP: 126/81  Pulse: (!) 107  Weight: 212 lb (96.2 kg)    Fetal Status: Fetal Heart Rate (bpm): 162 Fundal Height: 31 cm Movement: Present     General:  Alert, oriented and cooperative. Patient is in no acute distress.  Skin: Skin is warm and dry. No rash noted.   Cardiovascular: Normal heart rate noted  Respiratory: Normal respiratory effort, no problems with respiration noted  Abdomen: Soft, gravid, appropriate for gestational age. Pain/Pressure: Present     Pelvic: Vag. Bleeding: None     Cervical exam deferred        Extremities: Normal range of motion.  Edema: None  Mental Status: Normal mood and affect. Normal behavior. Normal judgment and thought content.   Urinalysis:      Assessment and Plan:  Pregnancy: G2P0010 at [redacted]w[redacted]d  1. 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 Rh negative state in antepartum period S/p rhogam 03/13/2021  3. Anxiety Doing better  Preterm labor symptoms and general obstetric precautions including but not limited to vaginal bleeding, contractions, leaking of fluid and fetal movement were reviewed in detail with the patient. Please refer to After Visit Summary for other counseling recommendations.  Return in about 2 weeks (around 04/24/2021) for Dyad patient, ob visit.   Clarnce Flock, MD

## 2021-04-22 ENCOUNTER — Encounter: Payer: Self-pay | Admitting: Family Medicine

## 2021-04-23 ENCOUNTER — Ambulatory Visit (INDEPENDENT_AMBULATORY_CARE_PROVIDER_SITE_OTHER): Payer: Medicaid Other | Admitting: Family Medicine

## 2021-04-23 ENCOUNTER — Other Ambulatory Visit: Payer: Self-pay

## 2021-04-23 VITALS — BP 113/80 | HR 118 | Wt 212.5 lb

## 2021-04-23 DIAGNOSIS — O26899 Other specified pregnancy related conditions, unspecified trimester: Secondary | ICD-10-CM

## 2021-04-23 DIAGNOSIS — Z3490 Encounter for supervision of normal pregnancy, unspecified, unspecified trimester: Secondary | ICD-10-CM

## 2021-04-23 DIAGNOSIS — Z6791 Unspecified blood type, Rh negative: Secondary | ICD-10-CM

## 2021-04-23 DIAGNOSIS — O26843 Uterine size-date discrepancy, third trimester: Secondary | ICD-10-CM

## 2021-04-23 DIAGNOSIS — F419 Anxiety disorder, unspecified: Secondary | ICD-10-CM

## 2021-04-23 NOTE — Progress Notes (Signed)
? ?  Subjective:  ?Danielle Perkins is a 26 y.o. G2P0010 at 29w5dbeing seen today for ongoing prenatal care.  She is currently monitored for the following issues for this low-risk pregnancy and has Supervision of low-risk pregnancy, unspecified trimester; Neoplasm of uncertain behavior of skin; Anxiety; and Rh negative state in antepartum period on their problem list. ? ?Patient reports no complaints.  Contractions: Irritability. Vag. Bleeding: Small.  Movement: Present. Denies leaking of fluid.  ? ?The following portions of the patient's history were reviewed and updated as appropriate: allergies, current medications, past family history, past medical history, past social history, past surgical history and problem list. Problem list updated. ? ?Objective:  ? ?Vitals:  ? 04/23/21 1056  ?BP: 113/80  ?Pulse: (!) 118  ?Weight: 212 lb 8 oz (96.4 kg)  ? ? ?Fetal Status: Fetal Heart Rate (bpm): 152   Movement: Present    ? ?General:  Alert, oriented and cooperative. Patient is in no acute distress.  ?Skin: Skin is warm and dry. No rash noted.   ?Cardiovascular: Normal heart rate noted  ?Respiratory: Normal respiratory effort, no problems with respiration noted  ?Abdomen: Soft, gravid, appropriate for gestational age. Pain/Pressure: Present     ?Pelvic: Vag. Bleeding: Small Vag D/C Character: Mucous   ?Cervical exam deferred        ?Extremities: Normal range of motion.  Edema: None  ?Mental Status: Normal mood and affect. Normal behavior. Normal judgment and thought content.  ? ?Urinalysis:     ? ?Assessment and Plan:  ?Pregnancy: G2P0010 at 321w5d ?1. Supervision of low-risk pregnancy, unspecified trimester ?BP and FHR normal ?Very worried about mucous discharge, cervix checked and is closed and posterior ?Reassured her this is likely normal discharge/leukorrhea of pregnancy ?Measuring about 3 weeks ahead on fundal height, given this plus BMI 37 will do repeat anatomy scan ? ?2. Anxiety ?Understandably high, but doing  OK ? ?3. Rh negative state in antepartum period ?S/p rhogam 03/13/2021 ? ?Preterm labor symptoms and general obstetric precautions including but not limited to vaginal bleeding, contractions, leaking of fluid and fetal movement were reviewed in detail with the patient. ?Please refer to After Visit Summary for other counseling recommendations.  ?Return in 2 weeks (on 05/07/2021) for Dyad patient, ob visit. ? ? ?EcClarnce FlockMD ? ?

## 2021-04-23 NOTE — Patient Instructions (Signed)

## 2021-04-25 ENCOUNTER — Encounter: Payer: Self-pay | Admitting: Family Medicine

## 2021-04-25 ENCOUNTER — Other Ambulatory Visit: Payer: Self-pay | Admitting: *Deleted

## 2021-04-25 ENCOUNTER — Ambulatory Visit: Payer: Medicaid Other | Admitting: *Deleted

## 2021-04-25 ENCOUNTER — Encounter: Payer: Self-pay | Admitting: *Deleted

## 2021-04-25 ENCOUNTER — Ambulatory Visit: Payer: Medicaid Other | Attending: Family Medicine

## 2021-04-25 ENCOUNTER — Other Ambulatory Visit: Payer: Self-pay

## 2021-04-25 VITALS — BP 140/62 | HR 106

## 2021-04-25 DIAGNOSIS — Z3A33 33 weeks gestation of pregnancy: Secondary | ICD-10-CM | POA: Insufficient documentation

## 2021-04-25 DIAGNOSIS — Z3493 Encounter for supervision of normal pregnancy, unspecified, third trimester: Secondary | ICD-10-CM

## 2021-04-25 DIAGNOSIS — O26849 Uterine size-date discrepancy, unspecified trimester: Secondary | ICD-10-CM

## 2021-04-25 DIAGNOSIS — O99213 Obesity complicating pregnancy, third trimester: Secondary | ICD-10-CM

## 2021-04-25 DIAGNOSIS — O26843 Uterine size-date discrepancy, third trimester: Secondary | ICD-10-CM | POA: Diagnosis not present

## 2021-04-25 DIAGNOSIS — O3660X0 Maternal care for excessive fetal growth, unspecified trimester, not applicable or unspecified: Secondary | ICD-10-CM | POA: Insufficient documentation

## 2021-04-25 DIAGNOSIS — Z3490 Encounter for supervision of normal pregnancy, unspecified, unspecified trimester: Secondary | ICD-10-CM

## 2021-04-25 DIAGNOSIS — O3663X0 Maternal care for excessive fetal growth, third trimester, not applicable or unspecified: Secondary | ICD-10-CM | POA: Insufficient documentation

## 2021-04-25 IMAGING — US US MFM OB FOLLOW-UP
1 series · 14 of 21 positions shown · non-contrast
Comparison: none

[Series 1: us mfm ob follow-up · 21 acquisitions, 14 frames shown]
[im 1/21]
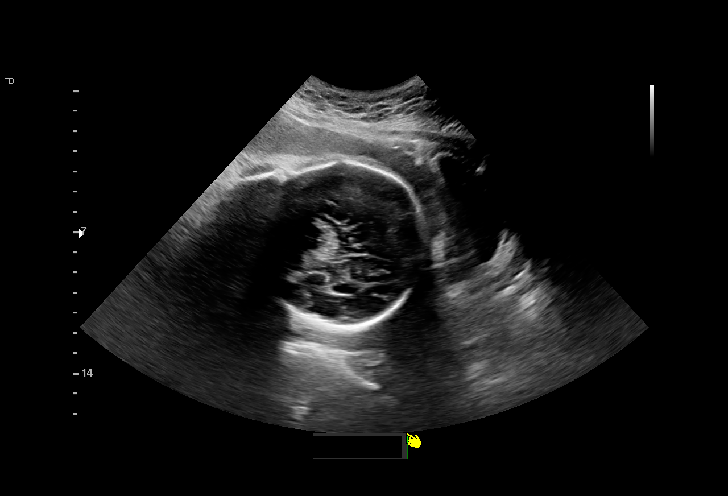
[im 3/21]
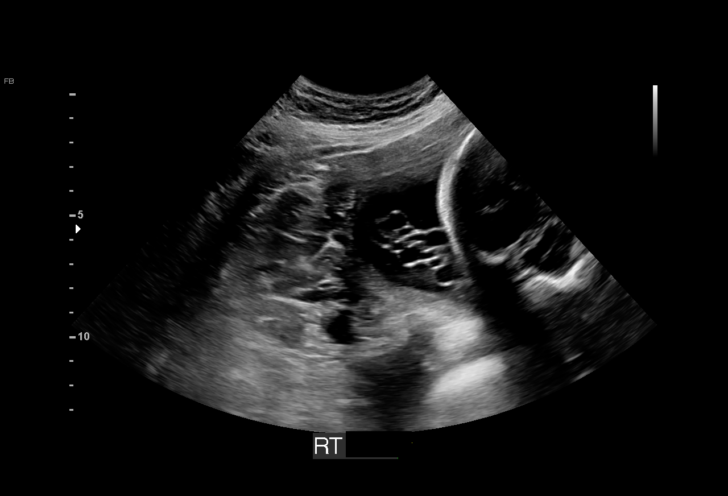
[im 4/21]
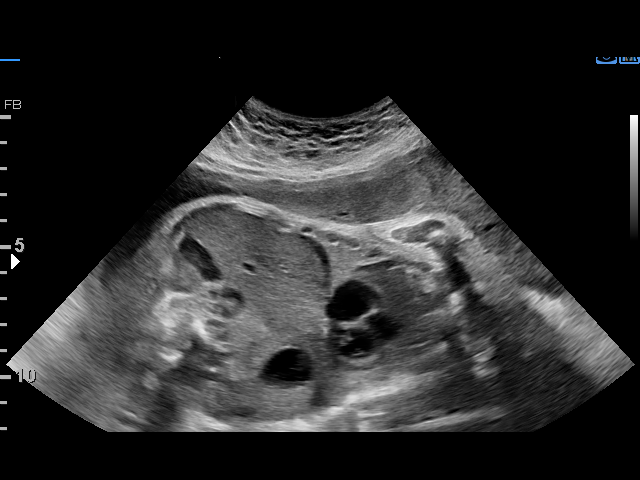
[im 6/21]
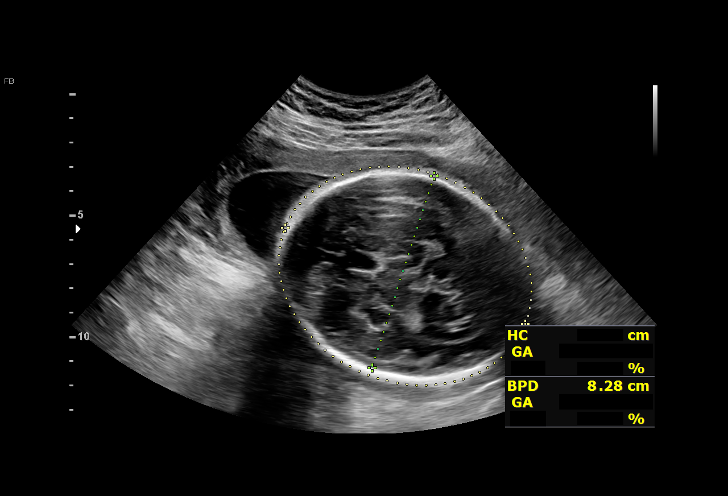
[im 7/21]
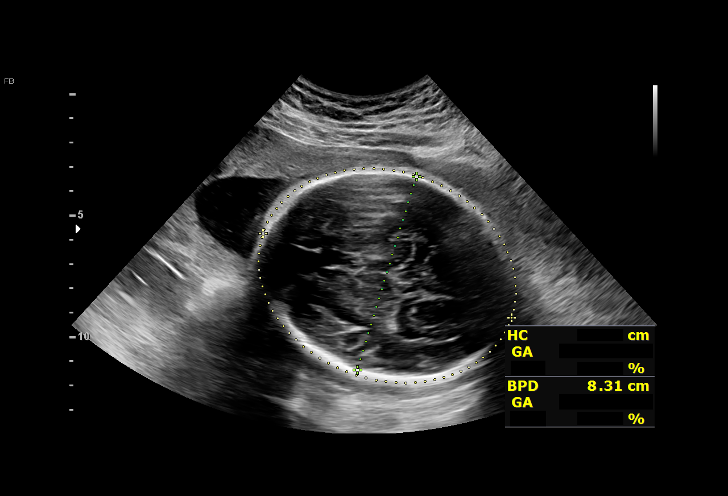
[im 9/21]
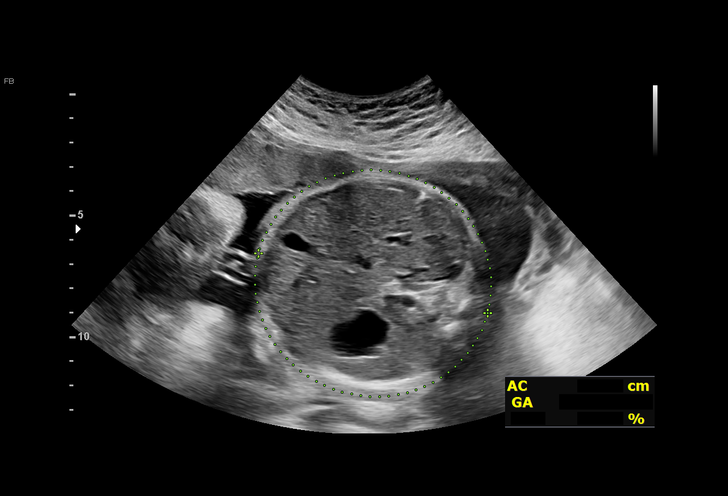
[im 10/21]
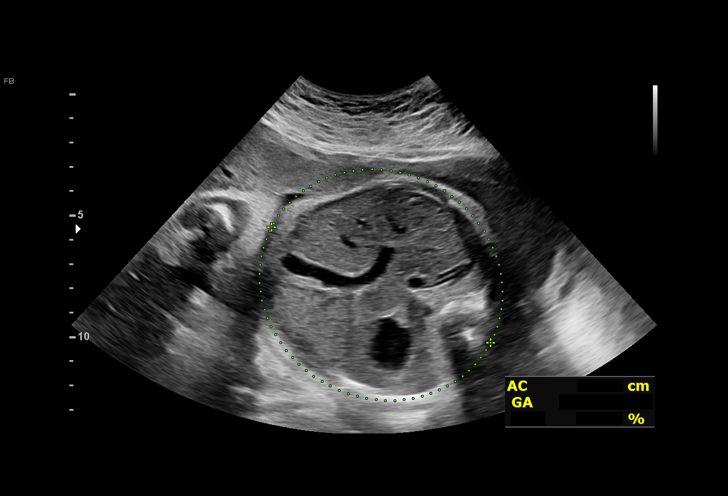
[im 12/21]
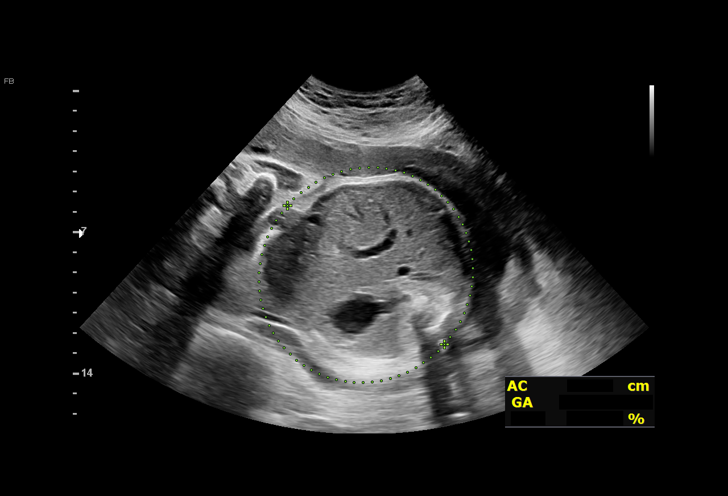
[im 13/21]
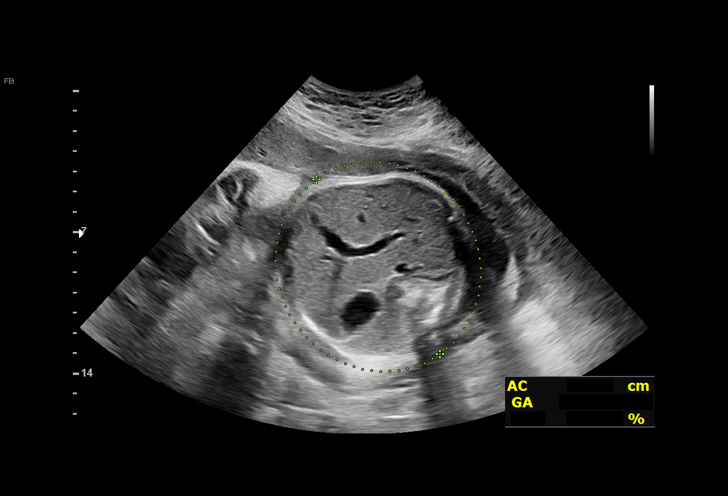
[im 15/21]
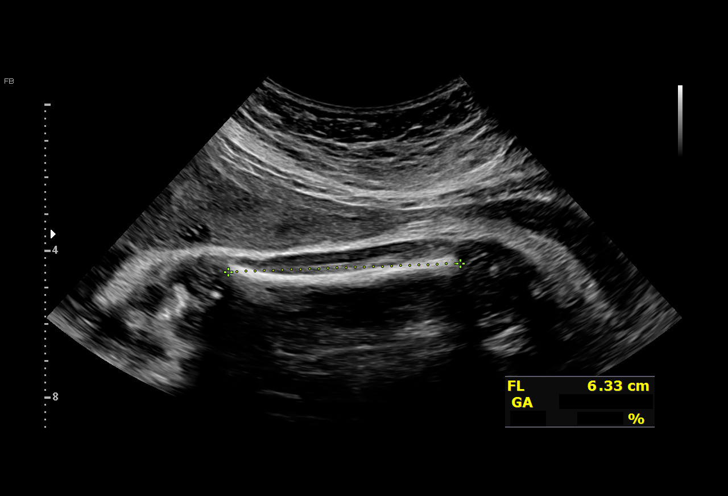
[im 16/21]
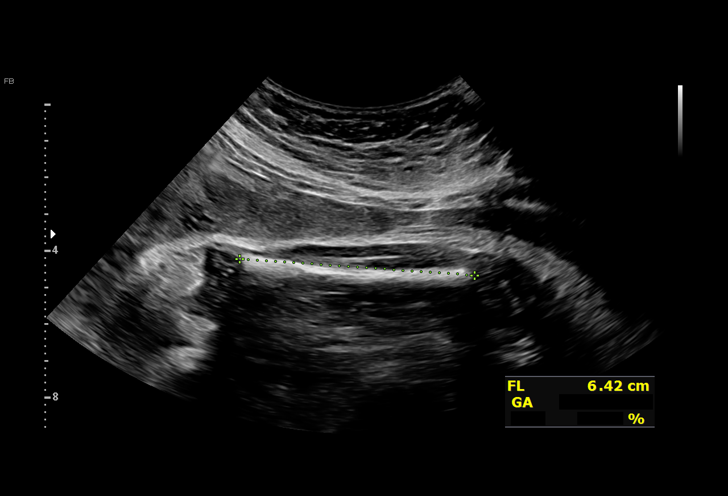
[im 18/21]
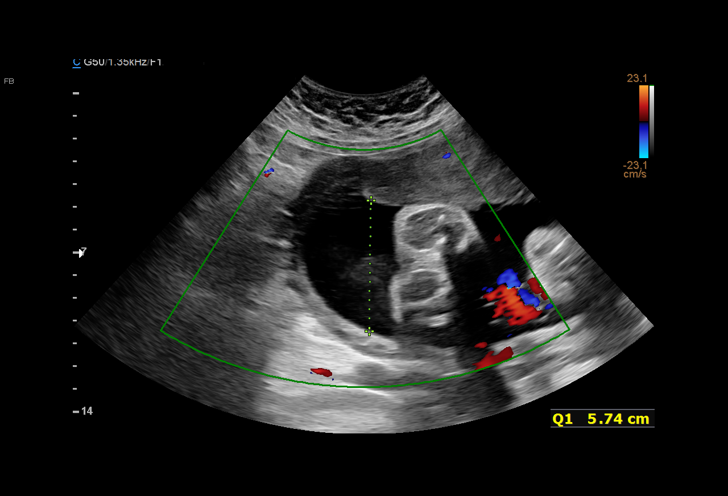
[im 19/21]
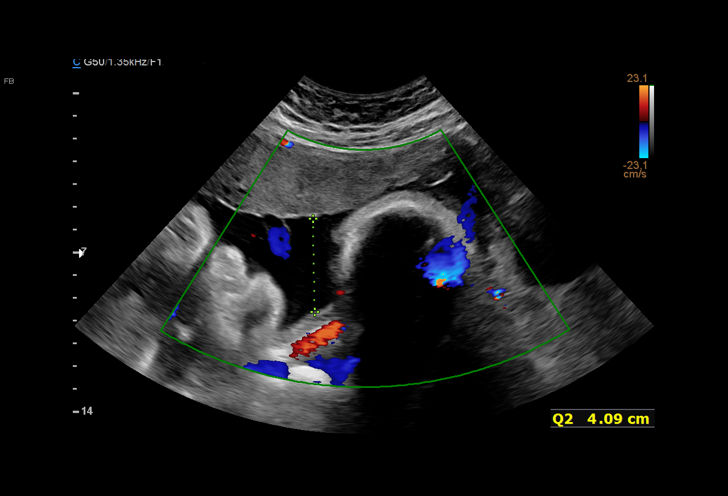
[im 21/21]
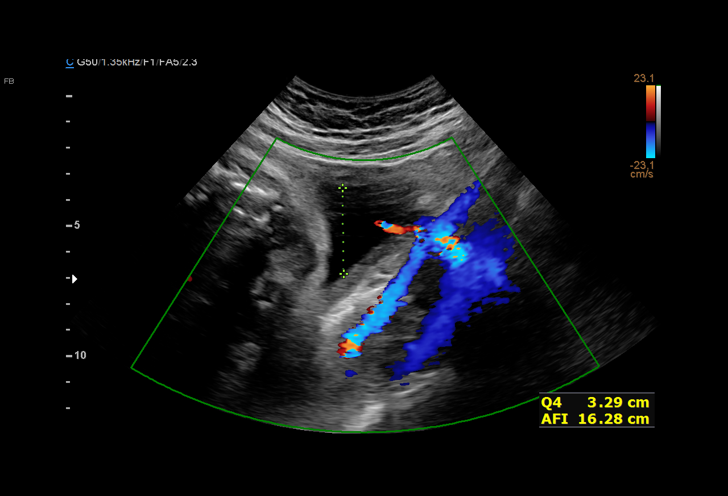

[14 of 21 positions shown; findings below may reference images not displayed]

Indications

 Maternal care for excessive fetal growth,
 third trimester, fetus unspecified
 Obesity complicating pregnancy, third
 trimester (BMI 36.85)
 33 weeks gestation of pregnancy
 Size of fetus inconsistent with dates in third
 trimester
 LR NIPS/ Horizon/ Negative AFP
Fetal Evaluation

 Num Of Fetuses:         1
 Fetal Heart Rate(bpm):  160
 Cardiac Activity:       Observed
 Presentation:           Cephalic
 Placenta:               Anterior
 P. Cord Insertion:      Previously Visualized

 Amniotic Fluid
 AFI FV:      Within normal limits

 AFI Sum(cm)     %Tile       Largest Pocket(cm)
 15.4            55

 RUQ(cm)       RLQ(cm)       LUQ(cm)        LLQ(cm)

Biometry
 BPD:      82.9  mm     G. Age:  33w 2d         54  %    CI:        74.14   %    70 - 86
                                                         FL/HC:      20.8   %    19.9 -
 HC:      305.7  mm     G. Age:  34w 0d         39  %    HC/AC:      0.93        0.96 -
 AC:      327.2  mm     G. Age:  36w 4d       > 99  %    FL/BPD:     76.8   %    71 - 87
 FL:       63.7  mm     G. Age:  32w 6d         36  %    FL/AC:      19.5   %    20 - 24

 Est. FW:    7167  gm    5 lb 11 oz      94  %
OB History

 Gravidity:    2          SAB:   1
Gestational Age

 LMP:           33w 0d        Date:  09/06/20                 EDD:   06/13/21
 U/S Today:     34w 1d                                        EDD:   06/05/21
 Best:          33w 0d     Det. By:  LMP  (09/06/20)          EDD:   06/13/21
Anatomy

 Stomach:               Appears normal, left   Bladder:                Appears normal
                        sided
 Kidneys:               Appear normal
Cervix Uterus Adnexa

 Adnexa
 No abnormality visualized.
Impression

 Uterine-size-date discrepancy .  Patient does not have
 gestational diabetes.  Blood pressure today at her office is
 140/62 mmHg.

 On today's ultrasound, the estimated fetal weight is at the
 94th percentile and the abdominal circumference
 measurement is at the 99 percentile.  Amniotic fluid is normal
 and good fetal activity seen

 I explained the limitations of ultrasound and accurately
 estimating fetal weight
Recommendations

 -An appointment was made for her to return in 4 weeks for
 fetal growth assessment.
                 Sugimoto, Ty

## 2021-04-30 ENCOUNTER — Telehealth: Payer: Self-pay | Admitting: Family Medicine

## 2021-04-30 NOTE — Telephone Encounter (Signed)
[facility] called in wanting to confirm if the breast pump order has been received for this patient and requested a call back if possible. The reference number for the patients order is [id number] ?

## 2021-05-01 ENCOUNTER — Encounter: Payer: Self-pay | Admitting: Family Medicine

## 2021-05-06 ENCOUNTER — Other Ambulatory Visit: Payer: Medicaid Other

## 2021-05-06 ENCOUNTER — Telehealth: Payer: Self-pay

## 2021-05-06 ENCOUNTER — Other Ambulatory Visit: Payer: Self-pay

## 2021-05-06 DIAGNOSIS — Z3490 Encounter for supervision of normal pregnancy, unspecified, unspecified trimester: Secondary | ICD-10-CM

## 2021-05-06 DIAGNOSIS — L299 Pruritus, unspecified: Secondary | ICD-10-CM

## 2021-05-06 NOTE — Telephone Encounter (Signed)
Patient called and spoke with front office to report generalized itching, worse on feet and hands. Clinical staff instructed pt to come for lab appt to check bile acids and CMP. Reviewed with Ernestina Patches, MD who approves orders and recommends follow up based on results. Spoke with patient while here for lab appt. Pt reports changing laundry detergent and body soap to sensitive skin options. Pt has used Eucerin cream for dry skin. Recommended pt try Bendryl for itching while waiting for results. Pt denies any abnormal symptoms. States she has felt baby move less just this morning. States her routine has been different than normal and she thinks this may be contributing. Reviewed how to perform kick count at home. Pt to present to MAU for evaluation if she counts less than 10 kicks in 2 hours. ?

## 2021-05-07 LAB — BILE ACIDS, TOTAL: Bile Acids Total: 3.9 umol/L (ref 0.0–10.0)

## 2021-05-07 LAB — COMPREHENSIVE METABOLIC PANEL
ALT: 43 IU/L — ABNORMAL HIGH (ref 0–32)
AST: 23 IU/L (ref 0–40)
Albumin/Globulin Ratio: 1.5 (ref 1.2–2.2)
Albumin: 3.6 g/dL — ABNORMAL LOW (ref 3.9–5.0)
Alkaline Phosphatase: 171 IU/L — ABNORMAL HIGH (ref 44–121)
BUN/Creatinine Ratio: 9 (ref 9–23)
BUN: 4 mg/dL — ABNORMAL LOW (ref 6–20)
Bilirubin Total: <0.2 mg/dL (ref 0.0–1.2)
CO2: 18 mmol/L — ABNORMAL LOW (ref 20–29)
Calcium: 8.7 mg/dL (ref 8.7–10.2)
Chloride: 108 mmol/L — ABNORMAL HIGH (ref 96–106)
Creatinine, Ser: 0.46 mg/dL — ABNORMAL LOW (ref 0.57–1.00)
Globulin, Total: 2.4 g/dL (ref 1.5–4.5)
Glucose: 75 mg/dL (ref 70–99)
Potassium: 4.2 mmol/L (ref 3.5–5.2)
Sodium: 139 mmol/L (ref 134–144)
Total Protein: 6 g/dL (ref 6.0–8.5)
eGFR: 136 mL/min/{1.73_m2} (ref >59)

## 2021-05-09 ENCOUNTER — Ambulatory Visit (INDEPENDENT_AMBULATORY_CARE_PROVIDER_SITE_OTHER): Payer: Medicaid Other | Admitting: Family Medicine

## 2021-05-09 ENCOUNTER — Other Ambulatory Visit: Payer: Self-pay

## 2021-05-09 VITALS — BP 106/76 | HR 94 | Wt 216.0 lb

## 2021-05-09 DIAGNOSIS — L299 Pruritus, unspecified: Secondary | ICD-10-CM

## 2021-05-09 DIAGNOSIS — O26899 Other specified pregnancy related conditions, unspecified trimester: Secondary | ICD-10-CM

## 2021-05-09 DIAGNOSIS — Z3490 Encounter for supervision of normal pregnancy, unspecified, unspecified trimester: Secondary | ICD-10-CM

## 2021-05-09 DIAGNOSIS — O3660X0 Maternal care for excessive fetal growth, unspecified trimester, not applicable or unspecified: Secondary | ICD-10-CM

## 2021-05-09 DIAGNOSIS — Z6791 Unspecified blood type, Rh negative: Secondary | ICD-10-CM

## 2021-05-09 NOTE — Progress Notes (Signed)
? ? ?  PRENATAL VISIT NOTE ? ?Subjective:  ?Danielle Perkins is a 26 y.o. G2P0010 at 28w0dbeing seen today for ongoing prenatal care.  She is currently monitored for the following issues for this low-risk pregnancy and has Supervision of low-risk pregnancy, unspecified trimester; Neoplasm of uncertain behavior of skin; Anxiety; Rh negative state in antepartum period; and LGA (large for gestational age) fetus affecting management of mother on their problem list. ? ?Patient reports no complaints.  Contractions: Irritability. Vag. Bleeding: None.  Movement: Present. Denies leaking of fluid.  ? ?The following portions of the patient's history were reviewed and updated as appropriate: allergies, current medications, past family history, past medical history, past social history, past surgical history and problem list.  ? ?Objective:  ? ?Vitals:  ? 05/09/21 1014  ?BP: 106/76  ?Pulse: 94  ?Weight: 216 lb (98 kg)  ? ? ?Fetal Status: Fetal Heart Rate (bpm): 148   Movement: Present    ? ?General:  Alert, oriented and cooperative. Patient is in no acute distress.  ?Skin: Skin is warm and dry. No rash noted.   ?Cardiovascular: Normal heart rate noted  ?Respiratory: Normal respiratory effort, no problems with respiration noted  ?Abdomen: Soft, gravid, appropriate for gestational age.  Pain/Pressure: Present     ?Pelvic: Cervical exam deferred        ?Extremities: Normal range of motion.  Edema: None  ?Mental Status: Normal mood and affect. Normal behavior. Normal judgment and thought content.  ? ?Assessment and Plan:  ?Pregnancy: G2P0010 at 319w0d1. Excessive fetal growth affecting management of pregnancy, antepartum, single or unspecified fetus ?Growth US[place]/8 EFW 94th% ?Next on 4/6 ? ?2. Rh negative state in antepartum period ?Received rhogam 1/25 ? ?3. Supervision of low-risk pregnancy, unspecified trimester ?Reviewed infant size and birth plan with client-- her partner is > 6 ft and reports his family had 10# babies. Patient  herself was 7# at 37 weeks.  ?Discussed ARRIVE trial data and importance of shared decision making ?She is having cramping and more painful contractions-- reports mother gave birth at 3772nd 3414 wks?Patient asked about ways to help bring on labor-- counseled to wait until 37 weeks and discussed oxytocin release, evening primrose, dates ?36 wk labs next visit ? ?4. Itching ?Bile acids negative ? ? ?Preterm labor symptoms and general obstetric precautions including but not limited to vaginal bleeding, contractions, leaking of fluid and fetal movement were reviewed in detail with the patient. ?Please refer to After Visit Summary for other counseling recommendations.  ? ?Return in about 1 week (around 05/16/2021) for Mom+Baby Combined Care. ? ?Future Appointments  ?Date Time Provider [place]?05/14/2021  2:15 PM MOMBABYDYAD [facility]  ?05/22/2021  1:35 PM WaGabriel CarinaCNM [facility]05/23/2021  3:30 PM [facility] NURSE [facility]  ?05/23/2021  3:45 PM [facility] US5 [facility]US [facility]  ?05/29/2021  1:35 PM EcClarnce FlockMD [facility]?06/07/2021  8:15 AM EcDione PloverMaAnnice NeedyMD [facility]? ? ?KiCaren MacadamMD ?

## 2021-05-09 NOTE — Patient Instructions (Signed)
These supplements and herbs are available over the counter without a prescription. They are often in the vitamin section of a pharmacy.  ? ?For pregnancy ?Blood Builder ?Magnesium - get Calm drink or gummies ?__________________________________________________________________________________________ ? ?To help ripen your Cervix/prepare the uterus (to get your cervix ready for labor): ?  ?Red Raspberry Leaf capsules:  two '300mg'$  or '400mg'$  tablets with each meal, 2-3 times a day  ?Potential Side Effects Of Raspberry Leaf:  ?Most women do not experience any side effects from drinking raspberry leaf tea. However, nausea and loose stools are possible. This can cause uterine irritability. If you notice having many braxton hicks contractions, stop this supplement. ?  ? ?Evening Primrose Oil capsules: may take 1 to 3 capsules daily. May also prick one to release the oil and insert it into your vagina at night.  ?One regimen would be to take 1 tablets three times per day and place 2 tablets in the vagina at night.  ? ?Some of the potential side effects:  ?Upset stomach  ?Loose stools or diarrhea  ?Headaches  ?Nausea ? ? ?_____________________________________________________________________________________________ ? ?For Labor: All of these can be use in the last trimester of pregnancy ? ?5-6 Dates a day -- This can help shorten your labor (may taste better if warmed in microwave until soft). This can also be combined with almond butter, any other nut butter, or wrap in bacon and bake, or toss in smoothies. Can also eat [place] bars. Found where raisins are in the grocery store ? ? ?______________________________________________________________________________________________ ?

## 2021-05-14 ENCOUNTER — Other Ambulatory Visit: Payer: Self-pay

## 2021-05-14 ENCOUNTER — Ambulatory Visit (INDEPENDENT_AMBULATORY_CARE_PROVIDER_SITE_OTHER): Payer: Medicaid Other | Admitting: Family Medicine

## 2021-05-14 ENCOUNTER — Other Ambulatory Visit (HOSPITAL_COMMUNITY)
Admission: RE | Admit: 2021-05-14 | Discharge: 2021-05-14 | Disposition: A | Discharge status: Home / Self Care | Payer: Medicaid Other | Source: Ambulatory Visit | Attending: Family Medicine | Admitting: Family Medicine

## 2021-05-14 VITALS — BP 126/83 | HR 90 | Wt 214.0 lb

## 2021-05-14 DIAGNOSIS — F419 Anxiety disorder, unspecified: Secondary | ICD-10-CM

## 2021-05-14 DIAGNOSIS — O3660X0 Maternal care for excessive fetal growth, unspecified trimester, not applicable or unspecified: Secondary | ICD-10-CM

## 2021-05-14 DIAGNOSIS — O26899 Other specified pregnancy related conditions, unspecified trimester: Secondary | ICD-10-CM

## 2021-05-14 DIAGNOSIS — L299 Pruritus, unspecified: Secondary | ICD-10-CM | POA: Insufficient documentation

## 2021-05-14 DIAGNOSIS — Z6791 Unspecified blood type, Rh negative: Secondary | ICD-10-CM

## 2021-05-14 DIAGNOSIS — Z3490 Encounter for supervision of normal pregnancy, unspecified, unspecified trimester: Secondary | ICD-10-CM

## 2021-05-14 MED ORDER — HYDROXYZINE PAMOATE 25 MG PO CAPS: 25.0000 mg | ORAL_CAPSULE | Freq: Three times a day (TID) | ORAL | 2 refills | Status: DC | PRN

## 2021-05-14 NOTE — Patient Instructions (Signed)

## 2021-05-14 NOTE — Progress Notes (Signed)
? ?  Subjective:  ?Danielle Perkins is a 26 y.o. G2P0010 at 45w5dbeing seen today for ongoing prenatal care.  She is currently monitored for the following issues for this low-risk pregnancy and has Supervision of low-risk pregnancy, unspecified trimester; Neoplasm of uncertain behavior of skin; Anxiety; Rh negative state in antepartum period; and LGA (large for gestational age) fetus affecting management of mother on their problem list. ? ?Patient reports no complaints.  Contractions: Irritability. Vag. Bleeding: None.  Movement: Present. Denies leaking of fluid.  ? ?The following portions of the patient's history were reviewed and updated as appropriate: allergies, current medications, past family history, past medical history, past social history, past surgical history and problem list. Problem list updated. ? ?Objective:  ? ?Vitals:  ? 05/14/21 1413  ?BP: 126/83  ?Pulse: 90  ?Weight: 214 lb (97.1 kg)  ? ? ?Fetal Status: Fetal Heart Rate (bpm): 143   Movement: Present    ? ?General:  Alert, oriented and cooperative. Patient is in no acute distress.  ?Skin: Skin is warm and dry. No rash noted.   ?Cardiovascular: Normal heart rate noted  ?Respiratory: Normal respiratory effort, no problems with respiration noted  ?Abdomen: Soft, gravid, appropriate for gestational age. Pain/Pressure: Present     ?Pelvic: Vag. Bleeding: None Vag D/C Character: Watery   ?Cervical exam deferred Dilation: Closed Effacement (%): Thick Station: -3  ?Extremities: Normal range of motion.     ?Mental Status: Normal mood and affect. Normal behavior. Normal judgment and thought content.  ? ?Urinalysis:     ? ?Assessment and Plan:  ?Pregnancy: G2P0010 at 365w5d ?1. Supervision of low-risk pregnancy, unspecified trimester ?BP and FHR normal ?Swabs today ?Cervix checked per request, still closed but more anterior and soft ?- [facility], beta strep (group b only) ? ?2. Excessive fetal growth  affecting management of pregnancy, antepartum, single or unspecified fetus ?94% on last growth USKoreabtained for S>D ?Discussed at last visit ?Has follow up growth scan scheduled for next week ? ?3. Rh negative state in antepartum period ?S/p rhogam ? ?4. Anxiety ?Symptoms worsening as she gets closer to delivery ?Does not want to up lexapro at this time ?Will add on vistaril for PRN symptoms ? ?5. Pruritus ?Ongoing symptoms, will recheck bile acids and CMP ?Vistaril may be helpful as well ? ?Preterm labor symptoms and general obstetric precautions including but not limited to vaginal bleeding, contractions, leaking of fluid and fetal movement were reviewed in detail with the patient. ?Please refer to After Visit Summary for other counseling recommendations.  ?Return in 1 week (on 05/21/2021) for Dyad patient, ob visit. ? ? ?EcClarnce FlockMD ? ?

## 2021-05-15 LAB — COMPREHENSIVE METABOLIC PANEL
ALT: 44 IU/L — ABNORMAL HIGH (ref 0–32)
AST: 25 IU/L (ref 0–40)
Albumin/Globulin Ratio: 1.3 (ref 1.2–2.2)
Albumin: 3.7 g/dL — ABNORMAL LOW (ref 3.9–5.0)
Alkaline Phosphatase: 192 IU/L — ABNORMAL HIGH (ref 44–121)
BUN/Creatinine Ratio: 9 (ref 9–23)
BUN: 5 mg/dL — ABNORMAL LOW (ref 6–20)
Bilirubin Total: 0.2 mg/dL (ref 0.0–1.2)
CO2: 20 mmol/L (ref 20–29)
Calcium: 9.2 mg/dL (ref 8.7–10.2)
Chloride: 100 mmol/L (ref 96–106)
Creatinine, Ser: 0.58 mg/dL (ref 0.57–1.00)
Globulin, Total: 2.9 g/dL (ref 1.5–4.5)
Glucose: 77 mg/dL (ref 70–99)
Potassium: 4.4 mmol/L (ref 3.5–5.2)
Sodium: 133 mmol/L — ABNORMAL LOW (ref 134–144)
Total Protein: 6.6 g/dL (ref 6.0–8.5)
eGFR: 129 mL/min/{1.73_m2} (ref >59)

## 2021-05-15 LAB — GC/CHLAMYDIA PROBE AMP (~~LOC~~) NOT AT ARMC
Chlamydia: NEGATIVE
Comment: NEGATIVE
Comment: NORMAL
Neisseria Gonorrhea: NEGATIVE

## 2021-05-15 LAB — BILE ACIDS, TOTAL: Bile Acids Total: 6.3 umol/L (ref 0.0–10.0)

## 2021-05-18 LAB — CULTURE, BETA STREP (GROUP B ONLY): Strep Gp B Culture: NEGATIVE

## 2021-05-20 ENCOUNTER — Other Ambulatory Visit: Payer: Self-pay

## 2021-05-20 ENCOUNTER — Inpatient Hospital Stay (HOSPITAL_COMMUNITY)
Admission: AD | Admit: 2021-05-20 | Discharge: 2021-05-20 | Disposition: A | Discharge status: Home / Self Care | Payer: Medicaid Other | Attending: Family Medicine | Admitting: Family Medicine

## 2021-05-20 ENCOUNTER — Encounter (HOSPITAL_COMMUNITY): Payer: Self-pay | Admitting: Family Medicine

## 2021-05-20 DIAGNOSIS — Z3A36 36 weeks gestation of pregnancy: Secondary | ICD-10-CM | POA: Diagnosis not present

## 2021-05-20 DIAGNOSIS — R519 Headache, unspecified: Secondary | ICD-10-CM | POA: Diagnosis not present

## 2021-05-20 DIAGNOSIS — R030 Elevated blood-pressure reading, without diagnosis of hypertension: Secondary | ICD-10-CM | POA: Diagnosis not present

## 2021-05-20 DIAGNOSIS — Z3490 Encounter for supervision of normal pregnancy, unspecified, unspecified trimester: Secondary | ICD-10-CM

## 2021-05-20 DIAGNOSIS — O26893 Other specified pregnancy related conditions, third trimester: Secondary | ICD-10-CM | POA: Insufficient documentation

## 2021-05-20 DIAGNOSIS — M7989 Other specified soft tissue disorders: Secondary | ICD-10-CM | POA: Insufficient documentation

## 2021-05-20 DIAGNOSIS — O471 False labor at or after 37 completed weeks of gestation: Secondary | ICD-10-CM | POA: Insufficient documentation

## 2021-05-20 LAB — COMPREHENSIVE METABOLIC PANEL
ALT: 38 U/L (ref 0–44)
AST: 28 U/L (ref 15–41)
Albumin: 2.7 g/dL — ABNORMAL LOW (ref 3.5–5.0)
Alkaline Phosphatase: 163 U/L — ABNORMAL HIGH (ref 38–126)
Anion gap: 9 (ref 5–15)
BUN: 5 mg/dL — ABNORMAL LOW (ref 6–20)
CO2: 19 mmol/L — ABNORMAL LOW (ref 22–32)
Calcium: 8.8 mg/dL — ABNORMAL LOW (ref 8.9–10.3)
Chloride: 106 mmol/L (ref 98–111)
Creatinine, Ser: 0.48 mg/dL (ref 0.44–1.00)
GFR, Estimated: >60 mL/min (ref >60)
Glucose, Bld: 103 mg/dL — ABNORMAL HIGH (ref 70–99)
Potassium: 4 mmol/L (ref 3.5–5.1)
Sodium: 134 mmol/L — ABNORMAL LOW (ref 135–145)
Total Bilirubin: 0.3 mg/dL (ref 0.3–1.2)
Total Protein: 6.6 g/dL (ref 6.5–8.1)

## 2021-05-20 LAB — URINALYSIS, ROUTINE W REFLEX MICROSCOPIC
Bilirubin Urine: NEGATIVE
Glucose, UA: NEGATIVE mg/dL
Hgb urine dipstick: NEGATIVE
Ketones, ur: 5 mg/dL — AB
Nitrite: NEGATIVE
Protein, ur: NEGATIVE mg/dL
Specific Gravity, Urine: 1.01 (ref 1.005–1.030)
pH: 7 (ref 5.0–8.0)

## 2021-05-20 LAB — PROTEIN / CREATININE RATIO, URINE
Creatinine, Urine: 81.36 mg/dL
Protein Creatinine Ratio: 0.17 mg/mg{Cre} — ABNORMAL HIGH (ref 0.00–0.15)
Total Protein, Urine: 14 mg/dL

## 2021-05-20 LAB — CBC
HCT: 32.7 % — ABNORMAL LOW (ref 36.0–46.0)
Hemoglobin: 10.4 g/dL — ABNORMAL LOW (ref 12.0–15.0)
MCH: 26.9 pg (ref 26.0–34.0)
MCHC: 31.8 g/dL (ref 30.0–36.0)
MCV: 84.7 fL (ref 80.0–100.0)
Platelets: 211 10*3/uL (ref 150–400)
RBC: 3.86 MIL/uL — ABNORMAL LOW (ref 3.87–5.11)
RDW: 14.1 % (ref 11.5–15.5)
WBC: 6.7 10*3/uL (ref 4.0–10.5)
nRBC: 0 % (ref 0.0–0.2)

## 2021-05-20 MED ORDER — CYCLOBENZAPRINE HCL 5 MG PO TABS
10.0000 mg | ORAL_TABLET | Freq: Once | ORAL | Status: AC
  Administered 2021-05-20: 10 mg via ORAL
  Filled 2021-05-20: qty 2

## 2021-05-20 NOTE — MAU Provider Note (Addendum)
?History  ? Danielle Perkins is a 26 y.o. G2P0010 at 56w10dwho presents to MAU for high blood pressure, headache, nausea, and swelling in her feet. Her BP that she measured last night was 114/98. Patient has had a 5/10 constant headache since 1 a.m and has also been feeling nauseous with no vomiting. She has not been able to control the pain with Tylenol that she took last night and this afternoon. She also mentions that her feet feel more swollen than usual. She has experienced some irregular contractions that have not produced significant pain. Patient reports that she has been eating and drinking okay. ? ?CSN: [id number]? ?Arrival date and time: 05/20/21 1553 ? ? Event Date/Time  ? First Provider Initiated Contact with Patient 05/20/21 1655   ?  ? ?Chief Complaint  ?Patient presents with  ? Headache  ? ?Headache  ? ? ?OB History   ? ? Gravida  ?2  ? Para  ?0  ? Term  ?0  ? Preterm  ?0  ? AB  ?1  ? Living  ?0  ?  ? ? SAB  ?1  ? IAB  ?0  ? Ectopic  ?0  ? Multiple  ?0  ? Live Births  ?0  ?   ?  ?  ? ? ?Past Medical History:  ?Diagnosis Date  ? Anemia   ? Anxiety   ? ? ?Past Surgical History:  ?Procedure Laterality Date  ? NO PAST SURGERIES    ? ? ?Family History  ?Problem Relation Age of Onset  ? Breast cancer Maternal Grandmother   ?     died at 362years old  ? Diabetes type II Paternal Grandmother   ? ? ?Social History  ? ?Tobacco Use  ? Smoking status: Never  ? Smokeless tobacco: Never  ?Vaping Use  ? Vaping Use: Never used  ?Substance Use Topics  ? Alcohol use: Not Currently  ?  Comment: 3-4, usually wine, liquor occasionally  ? Drug use: Never  ? ? ?Allergies: No Known Allergies ? ?Medications Prior to Admission  ?Medication Sig Dispense Refill Last Dose  ? acetaminophen (TYLENOL) 500 MG tablet Take 500 mg by mouth every 6 (six) hours as needed.   05/20/2021 at 1500  ? escitalopram (LEXAPRO) 10 MG tablet Take 1 tablet by mouth daily.   05/19/2021  ? famotidine (PEPCID) 20 MG tablet Take 1 tablet (20 mg total) by  mouth daily. 30 tablet 1 05/19/2021  ? ferrous sulfate 325 (65 FE) MG tablet Take 325 mg by mouth daily with breakfast.   05/19/2021  ? Prenatal Vit-Fe Fumarate-FA (MULTIVITAMIN-PRENATAL) 27-0.8 MG TABS tablet Take 1 tablet by mouth daily at 12 noon.   05/19/2021  ? Doxylamine-Pyridoxine (DICLEGIS) 10-10 MG TBEC Take 2 tablets by mouth at bedtime as needed. May add 1 tablet at breakfast and 1 tablet at lunch if needed. 100 tablet 2   ? hydrOXYzine (VISTARIL) 25 MG capsule Take 1 capsule (25 mg total) by mouth 3 (three) times daily as needed for anxiety or itching. 30 capsule 2   ? ? ?Review of Systems  ?Neurological:  Positive for headaches. Headache is constant in bilateral temples and posterior head. ?Physical Exam  ? ?Blood pressure 121/73, pulse (!) 103, temperature 98.7 ?F (37.1 ?C), temperature source Oral, resp. rate 17, height '5\' 3"'$  (1.6 m), weight 98 kg, last menstrual period 09/06/2020, SpO2 98 %. ? ?Physical Exam ?General: Alert, well-appearing woman in 3rd trimester of pregnancy. She is conversational  during exam.  ?HENT:  ?   Head: Normocephalic and atraumatic. ?Cardiovascular:  ?   Rate and Rhythm: Normal rate and regular rhythm.  ?   Heart sounds: Normal heart sounds.  ?Pulmonary:  ?   Effort: Pulmonary effort is normal.  ?   Breath sounds: Normal breath sounds.  ?Musculoskeletal:     ?   General: Mild swelling present in bilateral ankles and feet.  ?   Cervical back: Neck supple.  ?Skin: ?   General: Skin is warm and dry.  ?Neurological:  ?   Mental Status: She is alert, oriented.  ? ?MAU Course  ?Procedures ? ?MDM ? ?0510:  ?CBC, CMP, UA - evaluate for protein in urine, evaluate hemoglobin and platelet counts, evaluate kidney function, evaluate LFTs ? ?Flexeril for headache ? ?Cyclic BP for evaluation ? ?Assessment and Plan  ?Danielle Perkins  is a 26 y.o. G2P0010 at 30w10dwho presents to MAU for high blood pressure, headache, nausea, and swelling in her feet. Her swelling is mild and not concerning at  this time given her third-trimester pregnancy status. ? ?Headaches ?- Flexeril ? ?High BP ?- Cyclic BP measurements ?- CBC, CMP, UA ? ? ?Danielle Perkins ?05/20/2021, 5:05 PM  ? ? ?CNM attestation: ? ?I have seen and examined this patient and agree with above documentation in the medical student's note.  ? ?Danielle HUHTAis a 26y.o. G2P0010 at 376w4deporting high blood pressure, headache, and swelling. Patient reports headache started at around 0100. She took 1 ES Tylenol and that time. Headache has continued to linger throughout the day. She took 2 ES Tylenol around 1500 without any relief. She took her BP at home which she reports was 114/98. She currently rates her headache 5/10. Reports a history of migraines, but does not take medications for them. She denies vision changes or RUQ/epigastric pain. She reports occasional, non-painful braxton hicks contractions. Denies leaking fluid or vaginal bleeding. Endorses active fetal movement. ? ?[facility] Reports pregnancy has been uncomplicated thus far. Next appointment is on 4/5. ? ?PE: ?Patient Vitals for the past 24 hrs: ? BP Temp Temp src Pulse Resp SpO2 Height Weight  ?05/20/21 1825 132/79 -- -- 100 -- 98 % -- --  ?05/20/21 1715 124/87 -- -- (!) 105 -- -- -- --  ?05/20/21 1700 128/83 -- -- 95 -- -- -- --  ?05/20/21 1645 121/73 -- -- (!) 103 -- 98 % -- --  ?05/20/21 1640 110/75 -- -- (!) 106 -- 98 % -- --  ?05/20/21 1617 136/78 98.7 ?F (37.1 ?C) Oral (!) 105 17 99 % '5\' 3"'$  (1.6 m) 216 lb 1.6 oz (98 kg)  ? ?Gen: alert and oriented x4, calm, comfortable, no acute distress ?Resp: normal effort, no distress ?Heart: regular rate ?Abd: soft, non-tender, gravid ?MSK: mild nonpitting edema bilateral lower extremities ? ?FHR: Baseline 145 bpm, moderate variability, +15x15 accels, no decels ?Toco: quiet ? ?ROS, labs, PMH reviewed ? ?Orders Placed This Encounter  ?Procedures  ? Urinalysis, Routine w reflex  microscopic Urine, Clean Catch  ? CBC  ? Comprehensive metabolic panel  ? Protein / creatinine ratio, urine  ? Discharge patient  ? ?Meds ordered this encounter  ?Medications  ? cyclobenzaprine (FLEXERIL) tablet 10 mg  ? ? ?MDM ?Serial BP's-all normotensive. Given self-reported elevated BP at home, pre-e labs were drawn which were unremarkable. Flexeril PO given for headache. Headache improved from 5/10 to 2/10.  NST reactive and reassuring, toco quiet. At this time, low suspicion for pre-e given BP's, reassuring labs, and improvement of headache. Patient has prenatal appointment scheduled on Wednesday 4/5 which she was instructed to keep. Strict return precautions reviewed. ? ?Assessment: ?1. Supervision of low-risk pregnancy, unspecified trimester   ?2. [redacted] weeks gestation of pregnancy   ?3. Pregnancy headache in third trimester   ? ? ?Plan: ?- Discharge home in stable condition ?- Strict return precautions. ?- Return to MAU as needed for worsening symptoms ?- Follow-up at [facility] on 4/5 ? ? ?Renee Harder, CNM ?05/20/2021 ?6:42 PM  ?

## 2021-05-20 NOTE — MAU Note (Signed)
Danielle Perkins is a 26 y.o. at 11w4dhere in MAU reporting: has a HA since 0100, has taken Tylenol twice, still has the HA. Took her BP it was 114/98. Been having some nausea and her feet are swelling.  Has seen some spots when she stands up, denies RUQ pain. Some irreg contractions, not usually painful. ?No bleeding or leaking, reports +FM ?Onset of complaint: 0100 ?Pain score: 5 ?Vitals:  ? 05/20/21 1617  ?BP: 136/78  ?Pulse: (!) 105  ?Resp: 17  ?Temp: 98.7 ?F (37.1 ?C)  ?SpO2: 99%  ?   ?FHT:144 ?Lab orders placed from triage:  urine ?

## 2021-05-21 ENCOUNTER — Telehealth: Payer: Self-pay

## 2021-05-21 NOTE — Telephone Encounter (Signed)
Babyscripts left VM on nurse line with elevated BP of 114/98, reports headache. Pt seen in MAU shortly after. Will return for follow up ob appt tomorrow. ?

## 2021-05-22 ENCOUNTER — Ambulatory Visit (INDEPENDENT_AMBULATORY_CARE_PROVIDER_SITE_OTHER): Payer: Medicaid Other | Admitting: Certified Nurse Midwife

## 2021-05-22 VITALS — BP 120/89 | HR 101 | Wt 214.4 lb

## 2021-05-22 DIAGNOSIS — Z3A36 36 weeks gestation of pregnancy: Secondary | ICD-10-CM

## 2021-05-22 DIAGNOSIS — Z3403 Encounter for supervision of normal first pregnancy, third trimester: Secondary | ICD-10-CM

## 2021-05-22 DIAGNOSIS — F419 Anxiety disorder, unspecified: Secondary | ICD-10-CM

## 2021-05-22 DIAGNOSIS — O3663X0 Maternal care for excessive fetal growth, third trimester, not applicable or unspecified: Secondary | ICD-10-CM

## 2021-05-22 NOTE — Progress Notes (Signed)
? ?  Subjective:  ?Danielle Perkins is a 26 y.o. G2P0010 at 18w6dbeing seen today for ongoing prenatal care.  She is currently monitored for the following issues for this low-risk pregnancy and has Supervision of low-risk pregnancy, unspecified trimester; Neoplasm of uncertain behavior of skin; Anxiety; Rh negative state in antepartum period; LGA (large for gestational age) fetus affecting management of mother; and Pruritus on their problem list. ? ?Patient reports occasional contractions and increased vaginal pressure, requests cervical check today .  Contractions: Irritability. Vag. Bleeding: None.  Movement: Present. Denies leaking of fluid.  ? ?The following portions of the patient's history were reviewed and updated as appropriate: allergies, current medications, past family history, past medical history, past social history, past surgical history and problem list. Problem list updated. ? ?Objective:  ? ?Vitals:  ? 05/22/21 1337  ?BP: 120/89  ?Pulse: (!) 101  ?Weight: 214 lb 6.4 oz (97.3 kg)  ? ? ?Fetal Status: Fetal Heart Rate (bpm): 144 Fundal Height: 38 cm Movement: Present  Presentation: Vertex ? ?General:  Alert, oriented and cooperative. Patient is in no acute distress.  ?Skin: Skin is warm and dry. No rash noted.   ?Cardiovascular: Normal heart rate noted  ?Respiratory: Normal respiratory effort, no problems with respiration noted  ?Abdomen: Soft, gravid, appropriate for gestational age. Pain/Pressure: Present     ?Pelvic: Vag. Bleeding: None     ?Cervical exam performed Dilation: Fingertip Effacement (%): 50 Station: -3  ?Extremities: Normal range of motion.     ?Mental Status: Normal mood and affect. Normal behavior. Normal judgment and thought content.  ? ?Urinalysis: none today ? ?Assessment and Plan:  ?Pregnancy: G2P0010 at 324w6d ?1. Encounter for supervision of low-risk first pregnancy in third trimester ?- Doing well, feeling regular and vigorous fetal movement  ? ?2. [redacted] weeks gestation of  pregnancy ?- Routine OB care - GBS collected and negative 05/14/21 ? ?3. Excessive fetal growth affecting management of pregnancy in third trimester, single or unspecified fetus ?- Planning IOL at 39wks, will schedule at next visit. ? ?Preterm labor symptoms and general obstetric precautions including but not limited to vaginal bleeding, contractions, leaking of fluid and fetal movement were reviewed in detail with the patient. ?Please refer to After Visit Summary for other counseling recommendations.  ?Return in about 1 week (around 05/29/2021) for IN-PERSON, LOB. ? ? ?WaGabriel CarinaCNM ? ?

## 2021-05-23 ENCOUNTER — Ambulatory Visit: Payer: Medicaid Other | Admitting: *Deleted

## 2021-05-23 ENCOUNTER — Ambulatory Visit: Payer: Medicaid Other | Attending: Obstetrics and Gynecology

## 2021-05-23 VITALS — BP 118/68 | HR 96

## 2021-05-23 DIAGNOSIS — E669 Obesity, unspecified: Secondary | ICD-10-CM | POA: Diagnosis not present

## 2021-05-23 DIAGNOSIS — O3663X0 Maternal care for excessive fetal growth, third trimester, not applicable or unspecified: Secondary | ICD-10-CM | POA: Insufficient documentation

## 2021-05-23 DIAGNOSIS — O26843 Uterine size-date discrepancy, third trimester: Secondary | ICD-10-CM | POA: Diagnosis not present

## 2021-05-23 DIAGNOSIS — Z3493 Encounter for supervision of normal pregnancy, unspecified, third trimester: Secondary | ICD-10-CM | POA: Insufficient documentation

## 2021-05-23 DIAGNOSIS — O99213 Obesity complicating pregnancy, third trimester: Secondary | ICD-10-CM | POA: Diagnosis not present

## 2021-05-23 DIAGNOSIS — Z3490 Encounter for supervision of normal pregnancy, unspecified, unspecified trimester: Secondary | ICD-10-CM | POA: Insufficient documentation

## 2021-05-23 DIAGNOSIS — Z3A37 37 weeks gestation of pregnancy: Secondary | ICD-10-CM | POA: Insufficient documentation

## 2021-05-23 IMAGING — US US MFM OB FOLLOW-UP
1 series · 14 of 28 positions shown · non-contrast
Comparison: none

[Series 1: us mfm ob follow-up · 34 acquisitions, 14 frames shown]
[im 2/34]
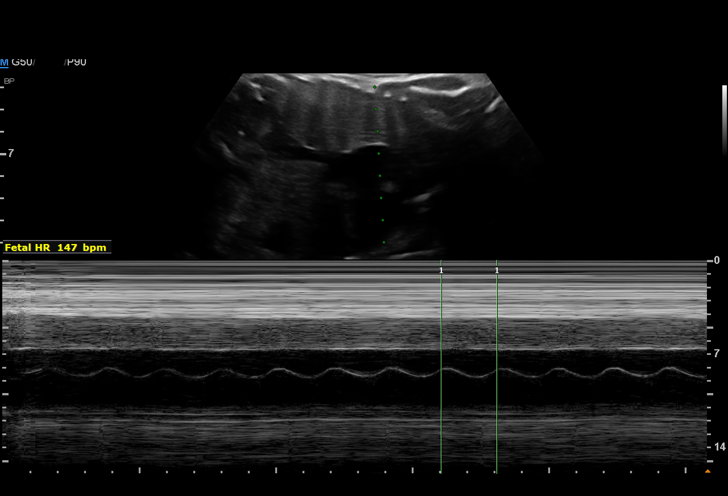
[im 4/34]
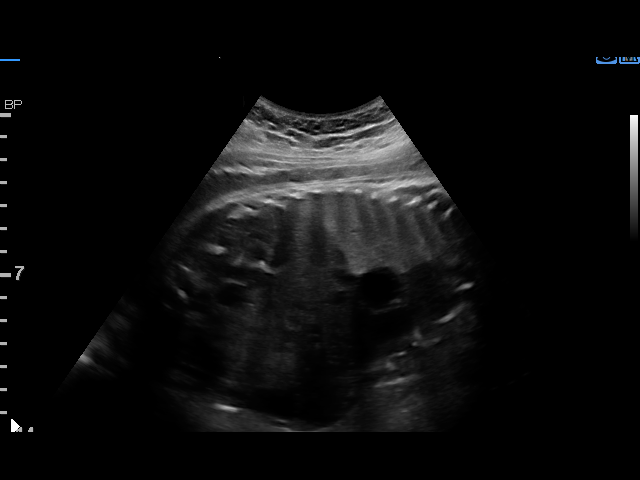
[im 7/34]
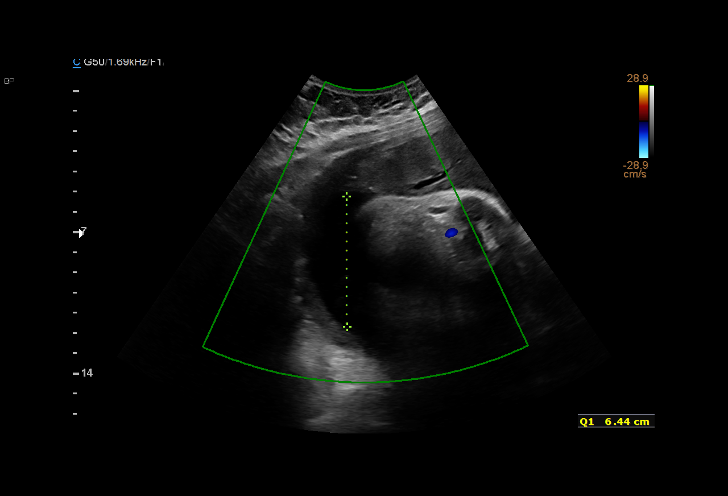
[im 9/34]
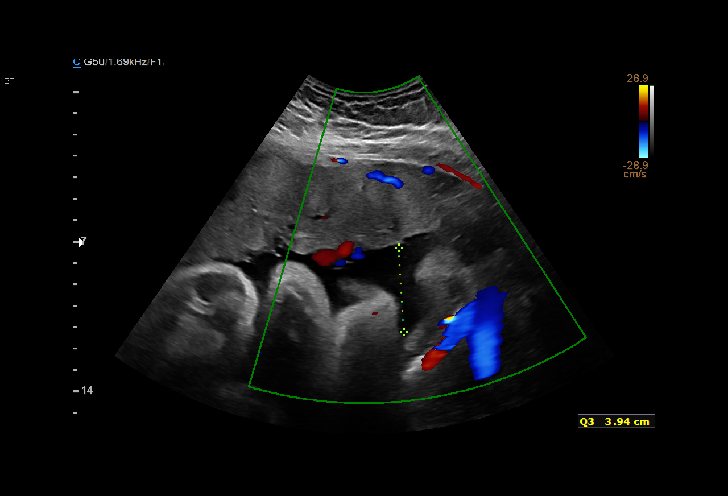
[im 12/34]
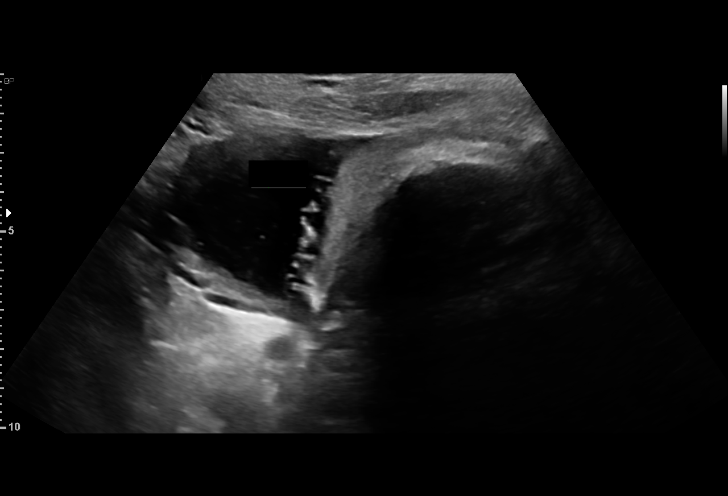
[im 14/34]
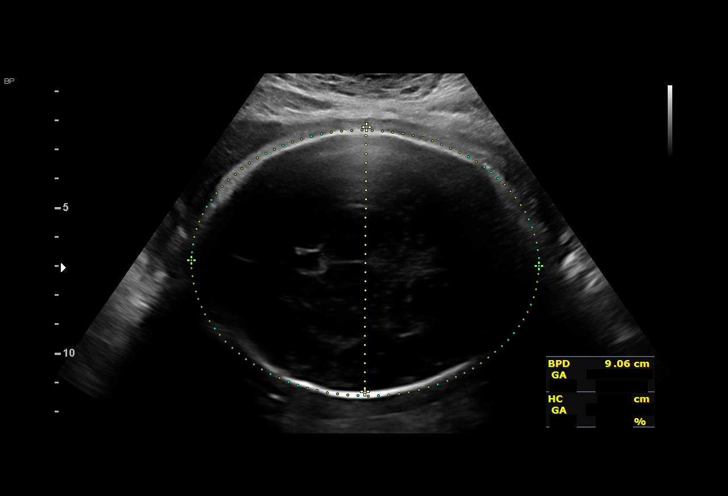
[im 16/34]
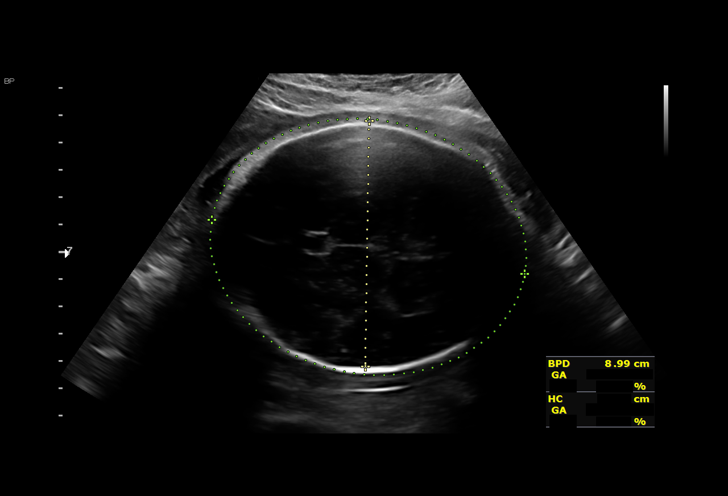
[im 19/34]
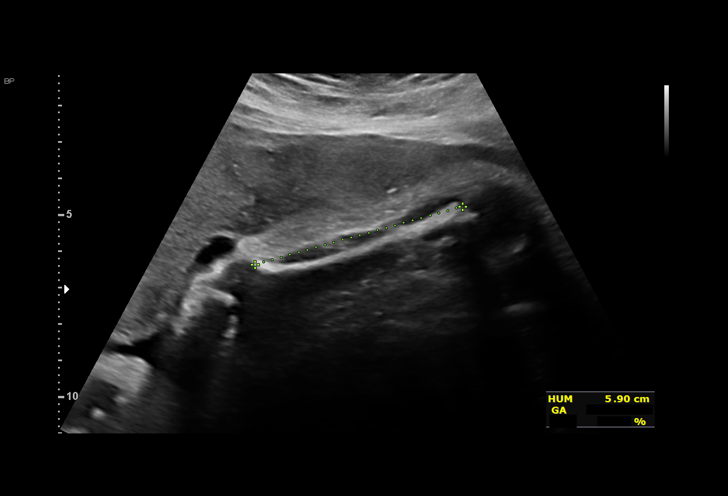
[im 21/34]
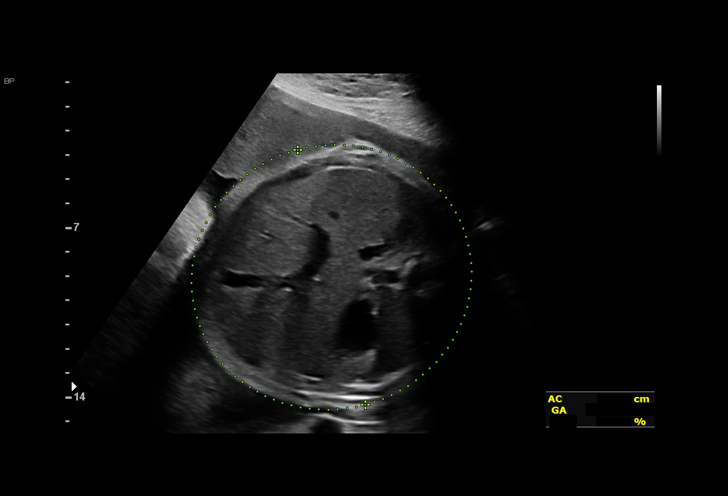
[im 24/34]
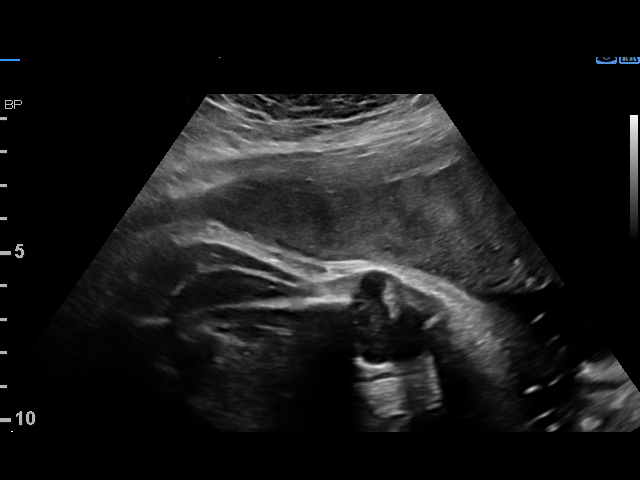
[im 26/34]
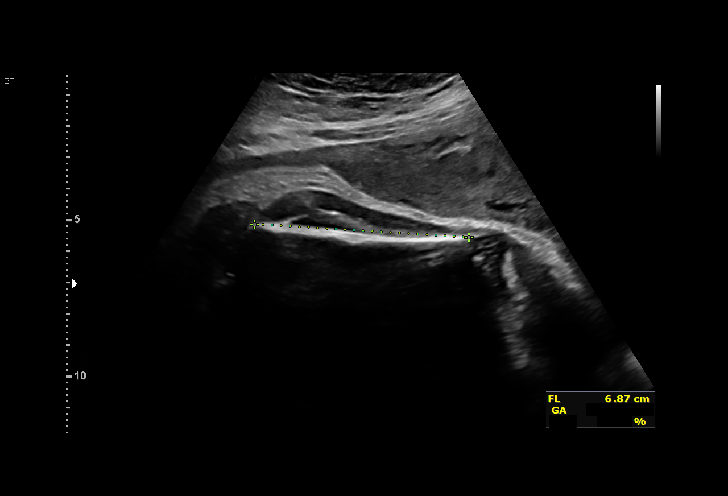
[im 29/34]
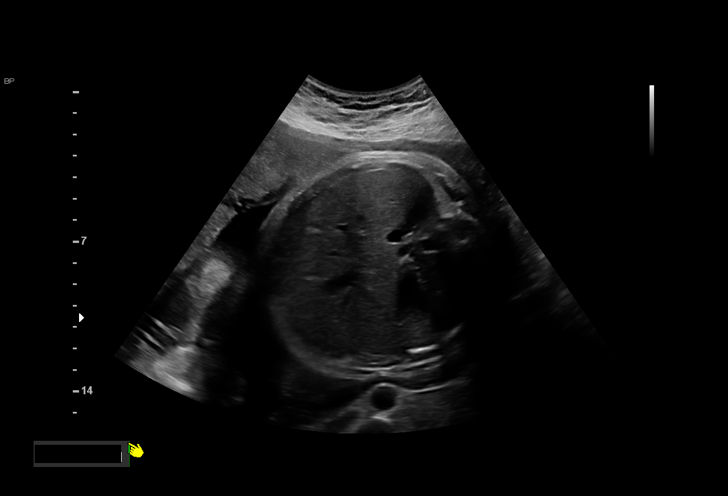
[im 31/34]
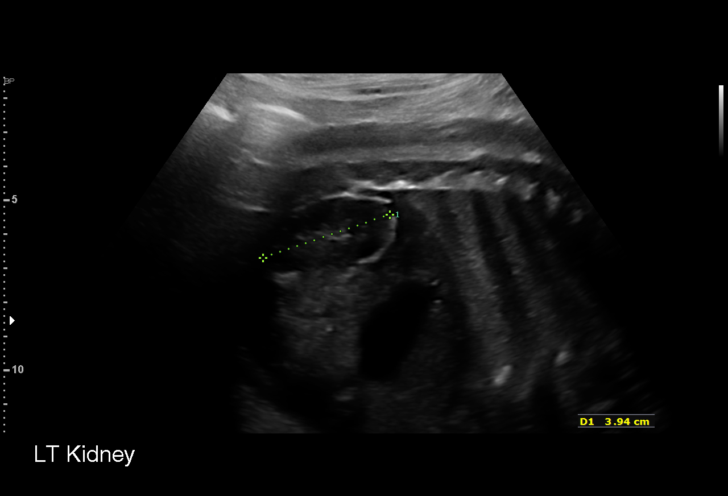
[im 34/34]
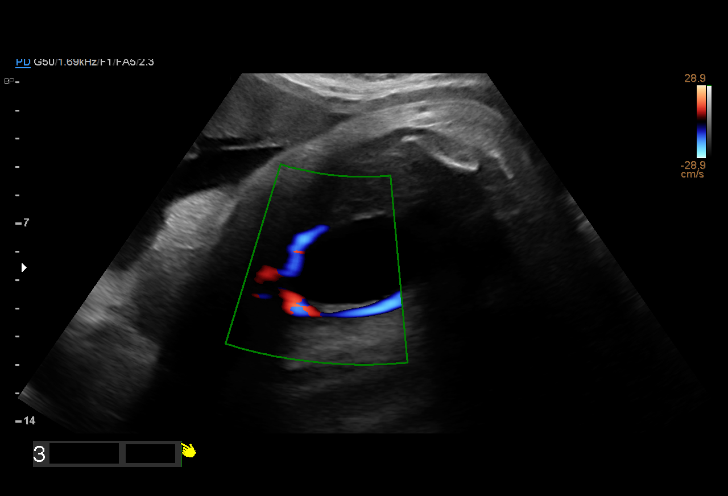

[14 of 28 positions shown; findings below may reference images not displayed]

Indications

 Maternal care for excessive fetal growth,
 third trimester, fetus unspecified
 Obesity complicating pregnancy, third
 trimester (BMI 36.85)
 Size of fetus inconsistent with dates in third
 trimester
 LR NIPS/ Horizon/ Negative AFP
 37 weeks gestation of pregnancy
Fetal Evaluation

 Num Of Fetuses:         1
 Fetal Heart Rate(bpm):  147
 Cardiac Activity:       Observed
 Presentation:           Cephalic
 Placenta:               Anterior
 P. Cord Insertion:      Previously Visualized

 Amniotic Fluid
 AFI FV:      Within normal limits

 AFI Sum(cm)     %Tile       Largest Pocket(cm)
 14.3            53

 RUQ(cm)       RLQ(cm)       LUQ(cm)        LLQ(cm)
 6.44          3.92          0
Biometry
 BPD:      90.4  mm     G. Age:  36w 4d         55  %    CI:        74.75   %    70 - 86
                                                         FL/HC:      20.7   %    20.8 -
 HC:      331.8  mm     G. Age:  37w 6d         44  %    HC/AC:      0.94        0.92 -
 AC:      351.6  mm     G. Age:  39w 0d         97  %    FL/BPD:     76.1   %    71 - 87
 FL:       68.8  mm     G. Age:  35w 2d         12  %    FL/AC:      19.6   %    20 - 24
 HUM:      59.2  mm     G. Age:  34w 2d         19  %

 LV:        4.2  mm

 Est. FW:    9944  gm      7 lb 5 oz     77  %
OB History

 Gravidity:    2          SAB:   1
Gestational Age

 LMP:           37w 0d        Date:  09/06/20                  EDD:   06/13/21
 U/S Today:     37w 1d                                        EDD:   06/12/21
 Best:          37w 0d     Det. By:  LMP  (09/06/20)          EDD:   06/13/21
Anatomy

 Cranium:               Appears normal         LVOT:                   Appears normal
 Cavum:                 Appears normal         Aortic Arch:            Previously seen
 Ventricles:            Appears normal         Ductal Arch:            Previously seen
 Choroid Plexus:        Previously seen        Diaphragm:              Appears normal
 Cerebellum:            Previously seen        Stomach:                Appears normal, left
                                                                       sided
 Posterior Fossa:       Previously seen        Abdomen:                Appears normal
 Nuchal Fold:           Previously seen        Abdominal Wall:         Previously seen
 Face:                  Orbits appear          Cord Vessels:           Appears normal (3
                        normal                                         vessel cord)
 Lips:                  Previously seen        Kidneys:                Appear normal
 Palate:                Previously seen        Bladder:                Appears normal
 Thoracic:              Appears normal         Spine:                  Previously seen
 Heart:                 Previously seen        Upper Extremities:      Previously seen
 RVOT:                  Previously seen        Lower Extremities:      Previously seen

 Other:  Female gender previously seen. Heels/feet, open hands/5th digits,
         nasal bone, lenses, VC, 3VV and 3VTV prev visualized. Technically
         difficult due to fetal position.
Cervix Uterus Adnexa

 Cervix
 Not visualized (advanced GA >66wks)

 Uterus
 Normal shape and size.

 Right Ovary
 Not visualized.

 Left Ovary
 Not visualized.
Impression
 Amniotic fluid is normal and good fetal activity is seen .Fetal
 growth is appropriate for gestational age. Cephalic
 presentation.

 BP today at our office is 118/68 mm Hg.
                Golightly, Pichon

## 2021-05-28 ENCOUNTER — Inpatient Hospital Stay (HOSPITAL_COMMUNITY)
Admission: AD | Admit: 2021-05-28 | Discharge: 2021-05-29 | Disposition: A | Discharge status: Home / Self Care | Payer: Medicaid Other | Attending: Family Medicine | Admitting: Family Medicine

## 2021-05-28 ENCOUNTER — Other Ambulatory Visit: Payer: Self-pay

## 2021-05-28 ENCOUNTER — Encounter (HOSPITAL_COMMUNITY): Payer: Self-pay | Admitting: Family Medicine

## 2021-05-28 DIAGNOSIS — O471 False labor at or after 37 completed weeks of gestation: Secondary | ICD-10-CM | POA: Insufficient documentation

## 2021-05-28 DIAGNOSIS — Z3490 Encounter for supervision of normal pregnancy, unspecified, unspecified trimester: Secondary | ICD-10-CM

## 2021-05-28 DIAGNOSIS — O479 False labor, unspecified: Secondary | ICD-10-CM

## 2021-05-28 DIAGNOSIS — Z3A37 37 weeks gestation of pregnancy: Secondary | ICD-10-CM | POA: Insufficient documentation

## 2021-05-28 NOTE — MAU Note (Signed)
.  ISABELLE MATT is a 26 y.o. at 88w5dhere in MAU reporting: contractions all day but more intense tonight. Having watery d/c since 12n and unsure if water is broken. Good FM today. 1cm last sve ?[facility] ?Onset of complaint: this am ?Pain score: 6 ?Vitals:  ? 05/28/21 2304 05/28/21 2306  ?BP:  129/83  ?Pulse: 97   ?Resp: 17   ?Temp: 98.6 ?F (37 ?C)   ?SpO2: 99%   ?   ?FHT:150 ?Lab orders placed from triage:  none ? ?

## 2021-05-29 ENCOUNTER — Ambulatory Visit (INDEPENDENT_AMBULATORY_CARE_PROVIDER_SITE_OTHER): Payer: Medicaid Other | Admitting: Family Medicine

## 2021-05-29 ENCOUNTER — Encounter: Payer: Self-pay | Admitting: Family Medicine

## 2021-05-29 VITALS — BP 127/85 | HR 101 | Wt 215.8 lb

## 2021-05-29 DIAGNOSIS — F419 Anxiety disorder, unspecified: Secondary | ICD-10-CM

## 2021-05-29 DIAGNOSIS — O479 False labor, unspecified: Secondary | ICD-10-CM

## 2021-05-29 DIAGNOSIS — Z6791 Unspecified blood type, Rh negative: Secondary | ICD-10-CM

## 2021-05-29 DIAGNOSIS — Z3A37 37 weeks gestation of pregnancy: Secondary | ICD-10-CM

## 2021-05-29 DIAGNOSIS — O471 False labor at or after 37 completed weeks of gestation: Secondary | ICD-10-CM | POA: Diagnosis present

## 2021-05-29 DIAGNOSIS — O26899 Other specified pregnancy related conditions, unspecified trimester: Secondary | ICD-10-CM

## 2021-05-29 DIAGNOSIS — Z3490 Encounter for supervision of normal pregnancy, unspecified, unspecified trimester: Secondary | ICD-10-CM

## 2021-05-29 DIAGNOSIS — O3663X0 Maternal care for excessive fetal growth, third trimester, not applicable or unspecified: Secondary | ICD-10-CM

## 2021-05-29 NOTE — Progress Notes (Signed)
? ?  Subjective:  ?MONIKE BRAGDON is a 26 y.o. G2P0010 at 68w6dbeing seen today for ongoing prenatal care.  She is currently monitored for the following issues for this low-risk pregnancy and has Supervision of low-risk pregnancy, unspecified trimester; Neoplasm of uncertain behavior of skin; Anxiety; Rh negative state in antepartum period; LGA (large for gestational age) fetus affecting management of mother; and Pruritus on their problem list. ? ?Patient reports no complaints.  Contractions: Irregular. Vag. Bleeding: Small.  Movement: Present. Denies leaking of fluid.  ? ?The following portions of the patient's history were reviewed and updated as appropriate: allergies, current medications, past family history, past medical history, past social history, past surgical history and problem list. Problem list updated. ? ?Objective:  ? ?Vitals:  ? 05/29/21 1335  ?BP: 127/85  ?Pulse: (!) 101  ?Weight: 215 lb 12.8 oz (97.9 kg)  ? ? ?Fetal Status: Fetal Heart Rate (bpm): 154   Movement: Present  Presentation: Vertex ? ?General:  Alert, oriented and cooperative. Patient is in no acute distress.  ?Skin: Skin is warm and dry. No rash noted.   ?Cardiovascular: Normal heart rate noted  ?Respiratory: Normal respiratory effort, no problems with respiration noted  ?Abdomen: Soft, gravid, appropriate for gestational age. Pain/Pressure: Present     ?Pelvic: Vag. Bleeding: Small     ?Cervical exam performed Dilation: 2 Effacement (%): 50 Station: -3  ?Extremities: Normal range of motion.     ?Mental Status: Normal mood and affect. Normal behavior. Normal judgment and thought content.  ? ?Urinalysis:     ? ?Assessment and Plan:  ?Pregnancy: G2P0010 at 334w6d ?1. Supervision of low-risk pregnancy, unspecified trimester ?BP and FHR normal ?Cervix unchanged from MAU visit early this morning ?Reviewed labor precautions, gave encouragement ?Interested in 39 week IOL, schedule checked ?Scheduled for 3918w0ddvised don't go until  called ?Form completed and faxed, orders placed ? ?2. Rh negative state in antepartum period ?S/p rhogam ? ?3. Excessive fetal growth affecting management of pregnancy in third trimester, single or unspecified fetus ?Normal EFW last growth scan ? ?4. Anxiety ?At baseline ? ?Term labor symptoms and general obstetric precautions including but not limited to vaginal bleeding, contractions, leaking of fluid and fetal movement were reviewed in detail with the patient. ?Please refer to After Visit Summary for other counseling recommendations.  ?Return in 1 week (on 06/05/2021) for ob visit, Dyad patient. ? ? ?EckClarnce FlockD ? ?

## 2021-05-29 NOTE — MAU Provider Note (Signed)
None  ?  ? ? ?S: Danielle Perkins is a 26 y.o. G2P0010 at [redacted]w[redacted]d who presents to MAU today complaining contractions . She denies vaginal bleeding. She denies LOF. She reports normal fetal movement.   ? ?O: BP 132/86   Pulse 82   Temp 98 ?F (36.7 ?C) (Oral)   Resp 16   Ht '5\' 3"'$  (1.6 m)   Wt 98.9 kg   LMP 09/06/2020   SpO2 98%   BMI 38.62 kg/m?  ?GENERAL: Well-developed, well-nourished female in no acute distress.  ?HEAD: Normocephalic, atraumatic.  ?CHEST: Normal effort of breathing, regular heart rate ?ABDOMEN: Soft, nontender, gravid ? ?Cervical exam:  ?Dilation: 1.5 ?Effacement (%): 50 ?Station: -3 ?Presentation: Vertex ?Exam by:: ARebeca Alert RN ? ? ?Fetal Monitoring: ?Baseline: 135 ?Variability: average ?Accelerations: present ?Decelerations: absent ?Contractions: irregular ?Dilation: 1.5 ?Effacement (%): 50 ?Station: -3 ?Presentation: Vertex ?Exam by:: ARebeca Alert RN ?Unchanged after two hours ? ? ?A: ?SIUP at 334w6d?False labor ? ?P: ?Discharge home ?Labor precautions ?Encouraged to return if she develops worsening of symptoms, increase in pain, fever, or other concerning symptoms.  ? ? ? ?WiSeabron SpatesCNM ?05/29/2021 2:41 AM ? ?

## 2021-05-29 NOTE — Progress Notes (Signed)
Reports mucous/blood discharge ? ?

## 2021-05-30 ENCOUNTER — Telehealth (HOSPITAL_COMMUNITY): Payer: Self-pay | Admitting: *Deleted

## 2021-05-30 NOTE — Telephone Encounter (Signed)
Preadmission screen  

## 2021-05-31 ENCOUNTER — Encounter (HOSPITAL_COMMUNITY): Payer: Self-pay | Admitting: *Deleted

## 2021-05-31 ENCOUNTER — Inpatient Hospital Stay (HOSPITAL_COMMUNITY)
Admission: AD | Admit: 2021-05-31 | Discharge: 2021-05-31 | Disposition: A | Discharge status: Home / Self Care | Payer: Medicaid Other | Attending: Obstetrics & Gynecology | Admitting: Obstetrics & Gynecology

## 2021-05-31 ENCOUNTER — Telehealth (HOSPITAL_COMMUNITY): Payer: Self-pay | Admitting: *Deleted

## 2021-05-31 ENCOUNTER — Encounter (HOSPITAL_COMMUNITY): Payer: Self-pay | Admitting: Obstetrics & Gynecology

## 2021-05-31 DIAGNOSIS — O99343 Other mental disorders complicating pregnancy, third trimester: Secondary | ICD-10-CM | POA: Diagnosis not present

## 2021-05-31 DIAGNOSIS — O479 False labor, unspecified: Secondary | ICD-10-CM

## 2021-05-31 DIAGNOSIS — Z3490 Encounter for supervision of normal pregnancy, unspecified, unspecified trimester: Secondary | ICD-10-CM

## 2021-05-31 DIAGNOSIS — O471 False labor at or after 37 completed weeks of gestation: Secondary | ICD-10-CM | POA: Insufficient documentation

## 2021-05-31 DIAGNOSIS — F419 Anxiety disorder, unspecified: Secondary | ICD-10-CM | POA: Insufficient documentation

## 2021-05-31 DIAGNOSIS — Z3A38 38 weeks gestation of pregnancy: Secondary | ICD-10-CM | POA: Insufficient documentation

## 2021-05-31 DIAGNOSIS — Z3689 Encounter for other specified antenatal screening: Secondary | ICD-10-CM | POA: Diagnosis not present

## 2021-05-31 NOTE — Telephone Encounter (Signed)
Preadmission screen  

## 2021-05-31 NOTE — MAU Note (Signed)
...  Danielle Perkins is a 26 y.o. at 33w1dhere in MAU reporting: CTX for the past three days. She states they will alternate between being spaced apart to now 4-6 minutes apart. Denies VB but states she passed yellow mucous like discharge this morning. No LOF. +FM but states it has been decreased since last night. She states she has only felt the baby move "maybe 3-4 times in the past hour." ? ?Was seen in MAU Tuesday night. Was 1cm and changed to 1.5 cm. She states she was checked in office Wednesday and was 2cm. ? ?Pain score:  ?3/10 lower abdomen (sometimes an 8) ? ?

## 2021-05-31 NOTE — MAU Provider Note (Signed)
?History  ?  ? ?CSN: [id number] ? ?Arrival date and time: 05/31/21 1503 ? ? Event Date/Time  ? First Provider Initiated Contact with Patient 05/31/21 1558   ?  ? ?Chief Complaint  ?Patient presents with  ? Contractions  ? Decreased Fetal Movement  ? ?26 year old G2 P0-0-1-0 at 38.1 weeks presenting for labor check.  Reports contractions every 4 to 6 minutes for the last 3 days.  Endorses decreased fetal movement today, has only felt 3-4 movements.  Denies vaginal bleeding or loss of fluid.  ? ? ?OB History   ? ? Gravida  ?2  ? Para  ?0  ? Term  ?0  ? Preterm  ?0  ? AB  ?1  ? Living  ?0  ?  ? ? SAB  ?1  ? IAB  ?0  ? Ectopic  ?0  ? Multiple  ?0  ? Live Births  ?0  ?   ?  ?  ? ? ?Past Medical History:  ?Diagnosis Date  ? Anemia   ? Anxiety   ? ? ?Past Surgical History:  ?Procedure Laterality Date  ? NO PAST SURGERIES    ? ? ?Family History  ?Problem Relation Age of Onset  ? Graves' disease Mother   ? Breast cancer Maternal Grandmother   ?     died at 59 years old  ? Diabetes type II Paternal Grandmother   ? ? ?Social History  ? ?Tobacco Use  ? Smoking status: Never  ? Smokeless tobacco: Never  ?Vaping Use  ? Vaping Use: Never used  ?Substance Use Topics  ? Alcohol use: Not Currently  ?  Comment: 3-4, usually wine, liquor occasionally  ? Drug use: Never  ? ? ?Allergies: No Known Allergies ? ?No medications prior to admission.  ? ? ?Review of Systems  ?Gastrointestinal:  Negative for abdominal pain.  ?Genitourinary:  Negative for vaginal bleeding and vaginal discharge.  ?Physical Exam  ? ?Blood pressure 125/85, pulse 99, temperature 98.4 ?F (36.9 ?C), temperature source Oral, resp. rate 17, last menstrual period 09/06/2020, SpO2 96 %. ? ?Physical Exam ?Vitals and nursing note reviewed.  ?Constitutional:   ?   General: She is not in acute distress. ?   Appearance: Normal appearance.  ?HENT:  ?   Head: Normocephalic and atraumatic.  ?Cardiovascular:  ?   Rate and Rhythm: Normal rate.  ?Pulmonary:  ?   Effort: Pulmonary  effort is normal. No respiratory distress.  ?Musculoskeletal:     ?   General: Normal range of motion.  ?   Cervical back: Normal range of motion.  ?Neurological:  ?   General: No focal deficit present.  ?   Mental Status: She is alert and oriented to person, place, and time.  ?Psychiatric:     ?   Mood and Affect: Mood normal.     ?   Behavior: Behavior normal.  ?EFM: 145 bpm, mod variability, + accels, no decels ?Toco: irreg ? ?No results found for this or any previous visit (from the past 24 hour(s)). ? ?MAU Course  ?Procedures ? ?MDM ?No cervical change or signs of labor.  Patient feeling good fetal movement during prolonged EFM, NST reactive.  Offered Rx for sleep but patient declines and will try Tylenol and Benadryl.  Stable for discharge home. ? ?Assessment and Plan  ? ?1. Supervision of low-risk pregnancy, unspecified trimester   ?2. [redacted] weeks gestation of pregnancy   ?3. NST (non-stress test) reactive   ? ?Discharge home ?  Follow-up on 06/06/2021 for eIOL-scheduled ?Labor precautions ?Fetal movement counts ? ?Allergies as of 05/31/2021   ?No Known Allergies ?  ? ?  ?Medication List  ?  ? ?TAKE these medications   ? ?acetaminophen 500 MG tablet ?Commonly known as: TYLENOL ?Take 500 mg by mouth every 6 (six) hours as needed. ?  ?escitalopram 10 MG tablet ?Commonly known as: LEXAPRO ?Take 1 tablet by mouth daily. ?  ?famotidine 20 MG tablet ?Commonly known as: Pepcid ?Take 1 tablet (20 mg total) by mouth daily. ?  ?ferrous sulfate 325 (65 FE) MG tablet ?Take 325 mg by mouth daily with breakfast. ?  ?hydrOXYzine 25 MG capsule ?Commonly known as: Vistaril ?Take 1 capsule (25 mg total) by mouth 3 (three) times daily as needed for anxiety or itching. ?  ?multivitamin-prenatal 27-0.8 MG Tabs tablet ?Take 1 tablet by mouth daily at 12 noon. ?  ? ?  ? ? ?Julianne Handler, CNM ?05/31/2021, 5:26 PM  ?

## 2021-06-03 ENCOUNTER — Encounter: Payer: Self-pay | Admitting: *Deleted

## 2021-06-03 ENCOUNTER — Other Ambulatory Visit: Payer: Self-pay | Admitting: Family Medicine

## 2021-06-03 ENCOUNTER — Telehealth (HOSPITAL_COMMUNITY): Payer: Self-pay | Admitting: *Deleted

## 2021-06-03 NOTE — Telephone Encounter (Signed)
Preadmission screen  

## 2021-06-06 ENCOUNTER — Inpatient Hospital Stay (HOSPITAL_COMMUNITY): Payer: Medicaid Other | Admitting: Anesthesiology

## 2021-06-06 ENCOUNTER — Encounter (HOSPITAL_COMMUNITY): Payer: Self-pay | Admitting: Family Medicine

## 2021-06-06 ENCOUNTER — Inpatient Hospital Stay (HOSPITAL_COMMUNITY)
Admission: AD | Admit: 2021-06-06 | Discharge: 2021-06-09 | DRG: 788 | Disposition: A | Discharge status: Home / Self Care | Payer: Medicaid Other | Attending: Obstetrics & Gynecology | Admitting: Obstetrics & Gynecology

## 2021-06-06 ENCOUNTER — Other Ambulatory Visit: Payer: Self-pay

## 2021-06-06 ENCOUNTER — Inpatient Hospital Stay (HOSPITAL_COMMUNITY): Payer: Medicaid Other

## 2021-06-06 DIAGNOSIS — D649 Anemia, unspecified: Secondary | ICD-10-CM | POA: Diagnosis not present

## 2021-06-06 DIAGNOSIS — Z30430 Encounter for insertion of intrauterine contraceptive device: Secondary | ICD-10-CM | POA: Diagnosis present

## 2021-06-06 DIAGNOSIS — N838 Other noninflammatory disorders of ovary, fallopian tube and broad ligament: Secondary | ICD-10-CM | POA: Diagnosis present

## 2021-06-06 DIAGNOSIS — Z6791 Unspecified blood type, Rh negative: Secondary | ICD-10-CM | POA: Diagnosis not present

## 2021-06-06 DIAGNOSIS — Z3A39 39 weeks gestation of pregnancy: Secondary | ICD-10-CM

## 2021-06-06 DIAGNOSIS — F419 Anxiety disorder, unspecified: Secondary | ICD-10-CM | POA: Diagnosis present

## 2021-06-06 DIAGNOSIS — O99344 Other mental disorders complicating childbirth: Secondary | ICD-10-CM | POA: Diagnosis present

## 2021-06-06 DIAGNOSIS — O26893 Other specified pregnancy related conditions, third trimester: Secondary | ICD-10-CM | POA: Diagnosis present

## 2021-06-06 DIAGNOSIS — O3663X0 Maternal care for excessive fetal growth, third trimester, not applicable or unspecified: Principal | ICD-10-CM | POA: Diagnosis present

## 2021-06-06 DIAGNOSIS — O3483 Maternal care for other abnormalities of pelvic organs, third trimester: Secondary | ICD-10-CM | POA: Diagnosis present

## 2021-06-06 DIAGNOSIS — Z23 Encounter for immunization: Secondary | ICD-10-CM | POA: Diagnosis not present

## 2021-06-06 DIAGNOSIS — O9902 Anemia complicating childbirth: Secondary | ICD-10-CM

## 2021-06-06 DIAGNOSIS — Z3490 Encounter for supervision of normal pregnancy, unspecified, unspecified trimester: Secondary | ICD-10-CM

## 2021-06-06 LAB — CBC
HCT: 34 % — ABNORMAL LOW (ref 36.0–46.0)
Hemoglobin: 10.8 g/dL — ABNORMAL LOW (ref 12.0–15.0)
MCH: 26.7 pg (ref 26.0–34.0)
MCHC: 31.8 g/dL (ref 30.0–36.0)
MCV: 84 fL (ref 80.0–100.0)
Platelets: 198 10*3/uL (ref 150–400)
RBC: 4.05 MIL/uL (ref 3.87–5.11)
RDW: 14.5 % (ref 11.5–15.5)
WBC: 8.3 10*3/uL (ref 4.0–10.5)
nRBC: 0 % (ref 0.0–0.2)

## 2021-06-06 LAB — TYPE AND SCREEN
ABO/RH(D): O NEG
Antibody Screen: NEGATIVE

## 2021-06-06 MED ORDER — LACTATED RINGERS IV SOLN: INTRAVENOUS | Status: DC

## 2021-06-06 MED ORDER — LIDOCAINE HCL (PF) 1 % IJ SOLN
INTRAMUSCULAR | Status: DC | PRN
  Administered 2021-06-06: 2 mL via EPIDURAL
  Administered 2021-06-06: 10 mL via EPIDURAL

## 2021-06-06 MED ORDER — PHENYLEPHRINE 80 MCG/ML (10ML) SYRINGE FOR IV PUSH (FOR BLOOD PRESSURE SUPPORT): 80.0000 ug | PREFILLED_SYRINGE | INTRAVENOUS | Status: DC | PRN

## 2021-06-06 MED ORDER — ACETAMINOPHEN 325 MG PO TABS: 650.0000 mg | ORAL_TABLET | ORAL | Status: DC | PRN

## 2021-06-06 MED ORDER — EPHEDRINE 5 MG/ML INJ: 10.0000 mg | INTRAVENOUS | Status: DC | PRN

## 2021-06-06 MED ORDER — ONDANSETRON HCL 4 MG/2ML IJ SOLN: 4.0000 mg | Freq: Four times a day (QID) | INTRAMUSCULAR | Status: DC | PRN

## 2021-06-06 MED ORDER — SOD CITRATE-CITRIC ACID 500-334 MG/5ML PO SOLN: 30.0000 mL | ORAL | Status: DC | PRN

## 2021-06-06 MED ORDER — FENTANYL-BUPIVACAINE-NACL 0.5-0.125-0.9 MG/250ML-% EP SOLN
EPIDURAL | Status: DC | PRN
Start: 2021-06-06 — End: 2021-06-07
  Administered 2021-06-06: 12 mL/h via EPIDURAL

## 2021-06-06 MED ORDER — OXYTOCIN-SODIUM CHLORIDE 30-0.9 UT/500ML-% IV SOLN
2.5000 [IU]/h | INTRAVENOUS | Status: DC
  Filled 2021-06-06: qty 500

## 2021-06-06 MED ORDER — LACTATED RINGERS IV SOLN: 500.0000 mL | INTRAVENOUS | Status: DC | PRN

## 2021-06-06 MED ORDER — FENTANYL CITRATE (PF) 100 MCG/2ML IJ SOLN: 50.0000 ug | INTRAMUSCULAR | Status: DC | PRN

## 2021-06-06 MED ORDER — LIDOCAINE HCL (PF) 1 % IJ SOLN: 30.0000 mL | INTRAMUSCULAR | Status: DC | PRN

## 2021-06-06 MED ORDER — OXYTOCIN BOLUS FROM INFUSION: 333.0000 mL | Freq: Once | INTRAVENOUS | Status: DC

## 2021-06-06 MED ORDER — TERBUTALINE SULFATE 1 MG/ML IJ SOLN: 0.2500 mg | Freq: Once | INTRAMUSCULAR | Status: DC | PRN

## 2021-06-06 MED ORDER — FENTANYL-BUPIVACAINE-NACL 0.5-0.125-0.9 MG/250ML-% EP SOLN
12.0000 mL/h | EPIDURAL | Status: DC | PRN
  Filled 2021-06-06: qty 250

## 2021-06-06 MED ORDER — DIPHENHYDRAMINE HCL 50 MG/ML IJ SOLN: 12.5000 mg | INTRAMUSCULAR | Status: DC | PRN

## 2021-06-06 MED ORDER — LACTATED RINGERS IV SOLN: 500.0000 mL | Freq: Once | INTRAVENOUS | Status: DC

## 2021-06-06 MED ORDER — OXYTOCIN-SODIUM CHLORIDE 30-0.9 UT/500ML-% IV SOLN
1.0000 m[IU]/min | INTRAVENOUS | Status: DC
  Administered 2021-06-06: 2 m[IU]/min via INTRAVENOUS

## 2021-06-06 MED ORDER — MISOPROSTOL 50MCG HALF TABLET
50.0000 ug | ORAL_TABLET | Freq: Once | ORAL | Status: AC
  Administered 2021-06-06: 50 ug via ORAL
  Filled 2021-06-06: qty 1

## 2021-06-06 NOTE — Progress Notes (Signed)
Patient ID: Danielle Perkins, female   DOB: [birth year], 26 y.o.   MRN: [id number] ?Getting epidural for pain ? ?SROM at 2300 ? ?FHR stable and reassuring ?UCs regular ? ?Cervix exam pending ? ? ?

## 2021-06-06 NOTE — H&P (Signed)
LABOR AND DELIVERY ADMISSION HISTORY AND PHYSICAL NOTE ? ?Danielle Perkins is a 26 y.o. female G2P0010 with IUP at 57w0dby LMP c/w 7wk UKoreapresenting for elective IOL.  ? ?She reports positive fetal movement. She denies leakage of fluid, vaginal bleeding, or contractions.  ? ?She plans on breast feeding. Her contraception plan is: post placental IUD. ? ?Pren[facility] History/Complications: ?[facility] ?Sono:  '@[redacted]w[redacted]d'$ , CWD, normal anatomy, cephalic presentation, anterior placenta, 77%ile, EFW 3311g ? ?Pregnancy complications:  ?- Rh neg ?- anxiety ? ?Past Medical History: ?Past Medical History:  ?Diagnosis Date  ? Anemia   ? Anxiety   ? ? ?Past Surgical History: ?Past Surgical History:  ?Procedure Laterality Date  ? NO PAST SURGERIES    ? ? ?Obstetrical History: ?OB History   ? ? Gravida  ?2  ? Para  ?0  ? Term  ?0  ? Preterm  ?0  ? AB  ?1  ? Living  ?0  ?  ? ? SAB  ?1  ? IAB  ?0  ? Ectopic  ?0  ? Multiple  ?0  ? Live Births  ?0  ?   ?  ?  ? ? ?Social History: ?Social History  ? ?Socioeconomic History  ? Marital status: Married  ?  Spouse name: Not on file  ? Number of children: Not on file  ? Years of education: Not on file  ? Highest education level: Not on file  ?Occupational History  ?  Employer: [employer]  ?  Comment: former employeer  ?Tobacco Use  ? Smoking status: Never  ? Smokeless tobacco: Never  ?Vaping Use  ? Vaping Use: Never used  ?Substance and Sexual Activity  ? Alcohol use: Not Currently  ?  Comment: 3-4, usually wine, liquor occasionally  ? Drug use: Never  ? Sexual activity: Yes  ?  Birth control/protection: None  ?Other Topics Concern  ? Not on file  ?Social History Narrative  ? Not on file  ? ?Social Determinants of Health  ? ?Financial Resource Strain: Not on file  ?Food Insecurity: No Food Insecurity  ? Worried About RCharity fundraiserin the Last Year: Never true  ? Ran Out of Food in the Last Year: Never true  ?Transportation Needs: No Transportation Needs  ? Lack of  Transportation (Medical): No  ? Lack of Transportation (Non-Medical): No  ?Physical Activity: Not on file  ?Stress: Not on file  ?Social Connections: Not on file  ? ? ?Family History: ?Family History  ?Problem Relation Age of Onset  ? Graves' disease Mother   ? Breast cancer Maternal Grandmother   ?     died at 352years old  ? Diabetes type II Paternal Grandmother   ? ? ?Allergies: ?No Known Allergies ? ?Medications Prior to Admission  ?Medication Sig Dispense Refill Last Dose  ? acetaminophen (TYLENOL) 500 MG tablet Take 500 mg by mouth every 6 (six) hours as needed.     ? escitalopram (LEXAPRO) 10 MG tablet Take 1 tablet by mouth daily.     ? famotidine (PEPCID) 20 MG tablet Take 1 tablet (20 mg total) by mouth daily. 30 tablet 1   ? ferrous sulfate 325 (65 FE) MG tablet Take 325 mg by mouth daily with breakfast.     ? hydrOXYzine (VISTARIL) 25 MG capsule Take 1 capsule (25 mg total) by mouth 3 (three) times daily as needed for anxiety or itching. 30 capsule 2   ? Prenatal  Vit-Fe Fumarate-FA (MULTIVITAMIN-PRENATAL) 27-0.8 MG TABS tablet Take 1 tablet by mouth daily at 12 noon.     ? ? ? ?Review of Systems  ?All systems reviewed and negative except as stated in HPI ? ?Physical Exam ?Last menstrual period 09/06/2020. ?General appearance: alert, oriented, NAD ?Lungs: normal respiratory effort ?Heart: regular rate ?Abdomen: soft, non-tender; gravid ?Extremities: No calf swelling or tenderness ?Presentation: cephalic by SVE ? ?Fetal monitoringBaseline: 140 bpm, Variability: Good {> 6 bpm), Accelerations: Reactive, and Decelerations: Absent ?Uterine activity: irregular ? ?Cervix: 2/Thick/-3, vertex, intact ?  ? ?Prenatal labs: ?ABO, Rh: --/--/O NEG (10/02 1155) ?Antibody: Negative (01/25 1655) ?Rubella: <20.0 (10/04 1526) ?RPR: Non Reactive (01/25 3748)  ?HBsAg: Negative (10/04 1526)  ?HIV: Non Reactive (01/25 2707)  ?GC/Chlamydia:  ?Neisseria Gonorrhea  ?Date Value Ref Range Status  ?05/14/2021 Negative  Final   ? ?Chlamydia  ?Date Value Ref Range Status  ?05/14/2021 Negative  Final  ? ?GBS: Negative/-- (03/28 1507)  ?2-hr GTT: normal ?Genetic screening:  low risk panorama ?Anatomy US: normal ? ?Prenatal Transfer Tool  ?Maternal Diabetes: No ?Genetic Screening: Normal ?Maternal Ultrasounds/Referrals: Normal ?Fetal Ultrasounds or other Referrals:  None ?Maternal Substance Abuse:  No ?Significant Maternal Medications:  Meds include: Other: escitalopram, hydroxyzine ?Significant Maternal Lab Results: Group B Strep negative and Rh negative ? ?No results found for this or any previous visit (from the past 24 hour(s)). ? ?Patient Active Problem List  ? Diagnosis Date Noted  ? Pruritus 05/14/2021  ? LGA (large for gestational age) fetus affecting management of mother 04/25/2021  ? Rh negative state in antepartum period 12/18/2020  ? Supervision of low-risk pregnancy, unspecified trimester 11/20/2020  ? Neoplasm of uncertain behavior of skin 11/20/2020  ? Anxiety 11/20/2020  ? ? ?Assessment: ?Danielle Perkins is a 27 y.o. G2P0010 at 4w0dhere for elective IOL.  ? ?#Labor: FB placed at 1115, will also give 50 mcg buccal cytotec ?#Pain: IV pain meds PRN, epidural upon request ?#FWB: Cat I ?#GBS/ID: neg ?#MOF: Breast ?#MOC: post placental Liletta IUD ?#Circ: n/a ?#Anxiety: hydroxyzine PRN ? ?MClarnce Flock?06/06/2021, 10:21 AM ? ?

## 2021-06-06 NOTE — Anesthesia Procedure Notes (Signed)
Epidural ?Patient location during procedure: OB ?Start time: 06/06/2021 11:17 PM ?End time: 06/06/2021 11:26 PM ? ?Staffing ?Anesthesiologist: Pervis Hocking, DO ?Performed: anesthesiologist  ? ?Preanesthetic Checklist ?Completed: patient identified, IV checked, risks and benefits discussed, monitors and equipment checked, pre-op evaluation and timeout performed ? ?Epidural ?Patient position: sitting ?Prep: DuraPrep and site prepped and draped ?Patient monitoring: continuous pulse ox, blood pressure, heart rate and cardiac monitor ?Approach: midline ?Location: L3-L4 ?Injection technique: LOR air ? ?Needle:  ?Needle type: Tuohy  ?Needle gauge: 17 G ?Needle length: 9 cm ?Needle insertion depth: 6.5 cm ?Catheter type: closed end flexible ?Catheter size: 19 Gauge ?Catheter at skin depth: 12 cm ?Test dose: negative ? ?Assessment ?Sensory level: T8 ?Events: blood not aspirated, injection not painful, no injection resistance, no paresthesia and negative IV test ? ?Additional Notes ?Patient identified. Risks/Benefits/Options discussed with patient including but not limited to bleeding, infection, nerve damage, paralysis, failed block, incomplete pain control, headache, blood pressure changes, nausea, vomiting, reactions to medication both or allergic, itching and postpartum back pain. Confirmed with bedside nurse the patient's most recent platelet count. Confirmed with patient that they are not currently taking any anticoagulation, have any bleeding history or any family history of bleeding disorders. Patient expressed understanding and wished to proceed. All questions were answered. Sterile technique was used throughout the entire procedure. Please see nursing notes for vital signs. Test dose was given through epidural catheter and negative prior to continuing to dose epidural or start infusion. Warning signs of high block given to the patient including shortness of breath, tingling/numbness in hands, complete motor  block, or any concerning symptoms with instructions to call for help. Patient was given instructions on fall risk and not to get out of bed. All questions and concerns addressed with instructions to call with any issues or inadequate analgesia.  Reason for block:procedure for pain ? ? ? ?

## 2021-06-06 NOTE — Anesthesia Preprocedure Evaluation (Signed)
Anesthesia Evaluation  ?Patient identified by MRN, date of birth, ID band ?Patient awake ? ? ? ?Reviewed: ?Allergy & Precautions, Patient's Chart, lab work & pertinent test results ? ?Airway ?Mallampati: II ? ?TM Distance: >3 FB ?Neck ROM: Full ? ? ? Dental ?no notable dental hx. ? ?  ?Pulmonary ?neg pulmonary ROS,  ?  ?Pulmonary exam normal ?breath sounds clear to auscultation ? ? ? ? ? ? Cardiovascular ?negative cardio ROS ?Normal cardiovascular exam ?Rhythm:Regular Rate:Normal ? ? ?  ?Neuro/Psych ?PSYCHIATRIC DISORDERS Anxiety negative neurological ROS ?   ? GI/Hepatic ?negative GI ROS, Neg liver ROS,   ?Endo/Other  ?BMI 38 ? Renal/GU ?negative Renal ROS  ?negative genitourinary ?  ?Musculoskeletal ?negative musculoskeletal ROS ?(+)  ? Abdominal ?(+) + obese,   ?Peds ?negative pediatric ROS ?(+)  Hematology ? ?(+) Blood dyscrasia, anemia , Hb 10.8, plt 198   ?Anesthesia Other Findings ? ? Reproductive/Obstetrics ?(+) Pregnancy ? ?  ? ? ? ? ? ? ? ? ? ? ? ? ? ?  ?  ? ? ? ? ? ? ? ? ?Anesthesia Physical ?Anesthesia Plan ? ?ASA: 2 ? ?Anesthesia Plan: Epidural  ? ?Post-op Pain Management:   ? ?Induction:  ? ?PONV Risk Score and Plan: 2 ? ?Airway Management Planned: Natural Airway ? ?Additional Equipment: None ? ?Intra-op Plan:  ? ?Post-operative Plan:  ? ?Informed Consent: I have reviewed the patients History and Physical, chart, labs and discussed the procedure including the risks, benefits and alternatives for the proposed anesthesia with the patient or authorized representative who has indicated his/her understanding and acceptance.  ? ? ? ? ? ?Plan Discussed with:  ? ?Anesthesia Plan Comments:   ? ? ? ? ? ? ?Anesthesia Quick Evaluation ? ?

## 2021-06-06 NOTE — Progress Notes (Signed)
LABOR PROGRESS NOTE ? ?QUENNA DOEPKE is a 26 y.o. G2P0010 at [redacted]w[redacted]d admitted for elective IOL. ? ?Subjective: ?Contractions not too bad ?Foley bulb just came out ? ?Objective: ?BP (!) 144/73   Pulse (!) 105   Temp 98.3 ?F (36.8 ?C) (Oral)   Resp 18   Ht '5\' 3"'$  (1.6 m)   Wt 97.9 kg   LMP 09/06/2020   BMI 38.23 kg/m?  or  ?Vitals:  ? 06/06/21 1027 06/06/21 1030 06/06/21 1046 06/06/21 1533  ?BP: 129/88 123/87  (!) 144/73  ?Pulse: (!) 112 (!) 109  (!) 105  ?Resp: '18 16  18  '$ ?Temp: 98.2 ?F (36.8 ?C)   98.3 ?F (36.8 ?C)  ?TempSrc: Oral   Oral  ?Weight:   97.9 kg   ?Height:   '5\' 3"'$  (1.6 m)   ? ? ? ?Dilation: 2 ?Effacement (%): Thick ?Station: -3 ?Presentation: Vertex ?Exam by:: Dr. EDione Plover?FHT: baseline rate 145, moderate varibility, +acel, no decel ?Toco: q2-3 min ? ?Labs: ?Lab Results  ?Component Value Date  ? WBC 8.3 06/06/2021  ? HGB 10.8 (L) 06/06/2021  ? HCT 34.0 (L) 06/06/2021  ? MCV 84.0 06/06/2021  ? PLT 198 06/06/2021  ? ? ?Patient Active Problem List  ? Diagnosis Date Noted  ? Encounter for elective induction of labor 06/06/2021  ? Pruritus 05/14/2021  ? LGA (large for gestational age) fetus affecting management of mother 04/25/2021  ? Rh negative state in antepartum period 12/18/2020  ? Supervision of low-risk pregnancy, unspecified trimester 11/20/2020  ? Neoplasm of uncertain behavior of skin 11/20/2020  ? Anxiety 11/20/2020  ? ? ?Assessment / Plan: ?26y.o. G2P0010 at 371w0dere for elective IOL. ? ?Labor: cervix now 3.5-4/70/-1,-2 s/p foley bulb and miso x1, will start pitocin 2x2, evaluate for AROM once she is in a good pattern ?Fetal Wellbeing:  Cat I ?Pain Control:  planning epidural ?GBS: neg ?Anticipated MOD:  NSVD ? ?Rh neg: rhogam eval pp ? ?Anxiety: hydroxyzine PRN ? ?MaClarnce FlockMD/MPH ?Attending Family Medicine Physician, Faculty Practice ?[facility]  ?06/06/2021, 3:52 PM ? ?

## 2021-06-06 NOTE — Progress Notes (Addendum)
Danielle Perkins is a 26 y.o. G2P0010 at 47w0dadmitted for elective IOL. ? ?Subjective: ?Patient is doing well and tolerating contractions okay. Doing some exercises with birthing ball. ? ?Objective: ?BP 129/86   Pulse 95   Temp 98.4 ?F (36.9 ?C) (Oral)   Resp 18  Ht '5\' 3"'$  (1.6 m)   Wt 97.9 kg   LMP 09/06/2020   BMI 38.23 kg/m?  ? ?Vitals:  ? 06/06/21 1803 06/06/21 1859 06/06/21 1912 06/06/21 2045  ?BP: 128/80 129/86  132/81  ?Pulse: 94 95  94  ?Resp: '18 18  18  '$ ?Temp:   98.4 ?F (36.9 ?C)   ?TempSrc:   Oral   ?Weight:      ?Height:      ?  ?FHT:  FHR: 140 bpm, variability: moderate,  accelerations:  Present,  decelerations:  Absent ?UC:   regular, every 2-3 minutes ?SVE:   Dilation: 4 ?Effacement (%): 70 ?Station: -2 ?Exam by:: YCarin Primrose RN ? ?Labs: ?Lab Results  ?Component Value Date  ? WBC 8.3 06/06/2021  ? HGB 10.8 (L) 06/06/2021  ? HCT 34.0 (L) 06/06/2021  ? MCV 84.0 06/06/2021  ? PLT 198 06/06/2021  ? ? ?Assessment / Plan: ?Danielle FULLARDis a 26y.o. G2P0010 at 326w0ddmitted for elective IOL. ? ?Labor: Cervix remains similar and FHT is stable. Plan to increase Pitocin to 14 mu/min and re-evaluate for AROM when she is in good pattern. ?Fetal Wellbeing:  Category I ?Pain Control:  Planning for epidural after ROM ?I/D:   GBS neg ?Anticipated MOD:  NSVD ? ?Rh neg: rhogam work-up postpartum ?Anxiety: hydroxyzine PRN ? ?Danielle Perkins ?06/06/2021, 8:54 PM ? ? ?

## 2021-06-07 ENCOUNTER — Encounter (HOSPITAL_COMMUNITY)
Admission: AD | Disposition: A | Discharge status: Home / Self Care | Payer: Self-pay | Source: Home / Self Care | Attending: Obstetrics & Gynecology

## 2021-06-07 ENCOUNTER — Encounter (HOSPITAL_COMMUNITY): Payer: Self-pay | Admitting: Family Medicine

## 2021-06-07 ENCOUNTER — Encounter: Payer: Medicaid Other | Admitting: Family Medicine

## 2021-06-07 DIAGNOSIS — Z3A39 39 weeks gestation of pregnancy: Secondary | ICD-10-CM

## 2021-06-07 LAB — CBC
HCT: 28.8 % — ABNORMAL LOW (ref 36.0–46.0)
Hemoglobin: 9.3 g/dL — ABNORMAL LOW (ref 12.0–15.0)
MCH: 27.2 pg (ref 26.0–34.0)
MCHC: 32.3 g/dL (ref 30.0–36.0)
MCV: 84.2 fL (ref 80.0–100.0)
Platelets: 200 10*3/uL (ref 150–400)
RBC: 3.42 MIL/uL — ABNORMAL LOW (ref 3.87–5.11)
RDW: 14.6 % (ref 11.5–15.5)
WBC: 13.7 10*3/uL — ABNORMAL HIGH (ref 4.0–10.5)
nRBC: 0 % (ref 0.0–0.2)

## 2021-06-07 LAB — RPR: RPR Ser Ql: NONREACTIVE

## 2021-06-07 SURGERY — Surgical Case
Anesthesia: Epidural | Wound class: Clean Contaminated

## 2021-06-07 MED ORDER — MORPHINE SULFATE (PF) 0.5 MG/ML IJ SOLN
INTRAMUSCULAR | Status: AC
  Filled 2021-06-07: qty 10

## 2021-06-07 MED ORDER — KETOROLAC TROMETHAMINE 30 MG/ML IJ SOLN: 30.0000 mg | Freq: Four times a day (QID) | INTRAMUSCULAR | Status: DC | PRN

## 2021-06-07 MED ORDER — ONDANSETRON HCL 4 MG/2ML IJ SOLN
INTRAMUSCULAR | Status: DC | PRN
  Administered 2021-06-07: 4 mg via INTRAVENOUS

## 2021-06-07 MED ORDER — TETANUS-DIPHTH-ACELL PERTUSSIS 5-2.5-18.5 LF-MCG/0.5 IM SUSY: 0.5000 mL | PREFILLED_SYRINGE | Freq: Once | INTRAMUSCULAR | Status: DC

## 2021-06-07 MED ORDER — FENTANYL CITRATE (PF) 100 MCG/2ML IJ SOLN
INTRAMUSCULAR | Status: AC
  Filled 2021-06-07: qty 2

## 2021-06-07 MED ORDER — PRENATAL MULTIVITAMIN CH
1.0000 | ORAL_TABLET | Freq: Every day | ORAL | Status: DC
  Administered 2021-06-07 – 2021-06-08 (×2): 1 via ORAL
  Filled 2021-06-07 (×2): qty 1

## 2021-06-07 MED ORDER — DIBUCAINE (PERIANAL) 1 % EX OINT: 1.0000 "application " | TOPICAL_OINTMENT | CUTANEOUS | Status: DC | PRN

## 2021-06-07 MED ORDER — ONDANSETRON HCL 4 MG/2ML IJ SOLN
INTRAMUSCULAR | Status: AC
  Filled 2021-06-07: qty 2

## 2021-06-07 MED ORDER — LIDOCAINE-EPINEPHRINE (PF) 2 %-1:200000 IJ SOLN
INTRAMUSCULAR | Status: DC | PRN
  Administered 2021-06-07: 3 mL via EPIDURAL

## 2021-06-07 MED ORDER — OXYTOCIN-SODIUM CHLORIDE 30-0.9 UT/500ML-% IV SOLN
INTRAVENOUS | Status: DC | PRN
  Administered 2021-06-07: 30 [IU] via INTRAVENOUS

## 2021-06-07 MED ORDER — CEFAZOLIN SODIUM-DEXTROSE 2-4 GM/100ML-% IV SOLN
INTRAVENOUS | Status: AC
  Filled 2021-06-07: qty 100

## 2021-06-07 MED ORDER — OXYTOCIN-SODIUM CHLORIDE 30-0.9 UT/500ML-% IV SOLN
2.5000 [IU]/h | INTRAVENOUS | Status: AC
  Administered 2021-06-07: 2.5 [IU]/h via INTRAVENOUS
  Filled 2021-06-07: qty 500

## 2021-06-07 MED ORDER — FENTANYL CITRATE (PF) 100 MCG/2ML IJ SOLN
INTRAMUSCULAR | Status: DC | PRN
  Administered 2021-06-07: 100 ug via EPIDURAL

## 2021-06-07 MED ORDER — KETOROLAC TROMETHAMINE 30 MG/ML IJ SOLN: 30.0000 mg | Freq: Once | INTRAMUSCULAR | Status: DC | PRN

## 2021-06-07 MED ORDER — WITCH HAZEL-GLYCERIN EX PADS: 1.0000 "application " | MEDICATED_PAD | CUTANEOUS | Status: DC | PRN

## 2021-06-07 MED ORDER — DEXAMETHASONE SODIUM PHOSPHATE 10 MG/ML IJ SOLN
INTRAMUSCULAR | Status: DC | PRN
  Administered 2021-06-07: 10 mg via INTRAVENOUS

## 2021-06-07 MED ORDER — DEXAMETHASONE SODIUM PHOSPHATE 10 MG/ML IJ SOLN
INTRAMUSCULAR | Status: AC
Start: 2021-06-07 — End: ?
  Filled 2021-06-07: qty 1

## 2021-06-07 MED ORDER — IBUPROFEN 600 MG PO TABS: 600.0000 mg | ORAL_TABLET | Freq: Four times a day (QID) | ORAL | Status: DC

## 2021-06-07 MED ORDER — DEXMEDETOMIDINE (PRECEDEX) IN NS 20 MCG/5ML (4 MCG/ML) IV SYRINGE
PREFILLED_SYRINGE | INTRAVENOUS | Status: DC | PRN
  Administered 2021-06-07: 8 ug via INTRAVENOUS
  Administered 2021-06-07: 12 ug via INTRAVENOUS

## 2021-06-07 MED ORDER — CEFAZOLIN SODIUM-DEXTROSE 2-3 GM-%(50ML) IV SOLR
INTRAVENOUS | Status: DC | PRN
  Administered 2021-06-07: 2 g via INTRAVENOUS

## 2021-06-07 MED ORDER — FENTANYL CITRATE (PF) 100 MCG/2ML IJ SOLN
INTRAMUSCULAR | Status: AC
Start: 2021-06-07 — End: ?
  Filled 2021-06-07: qty 2

## 2021-06-07 MED ORDER — MORPHINE SULFATE (PF) 0.5 MG/ML IJ SOLN
INTRAMUSCULAR | Status: DC | PRN
  Administered 2021-06-07: 3 mg via EPIDURAL

## 2021-06-07 MED ORDER — SIMETHICONE 80 MG PO CHEW
80.0000 mg | CHEWABLE_TABLET | Freq: Three times a day (TID) | ORAL | Status: DC
  Administered 2021-06-07 – 2021-06-09 (×6): 80 mg via ORAL
  Filled 2021-06-07 (×6): qty 1

## 2021-06-07 MED ORDER — LACTATED RINGERS IV SOLN: INTRAVENOUS | Status: DC | PRN

## 2021-06-07 MED ORDER — SODIUM CHLORIDE 0.9% FLUSH: 3.0000 mL | INTRAVENOUS | Status: DC | PRN

## 2021-06-07 MED ORDER — SODIUM CHLORIDE 0.9 % IR SOLN
Status: DC | PRN
  Administered 2021-06-07: 1

## 2021-06-07 MED ORDER — SIMETHICONE 80 MG PO CHEW: 80.0000 mg | CHEWABLE_TABLET | ORAL | Status: DC | PRN

## 2021-06-07 MED ORDER — DIPHENHYDRAMINE HCL 25 MG PO CAPS
25.0000 mg | ORAL_CAPSULE | ORAL | Status: DC | PRN
Start: 2021-06-07 — End: 2021-06-09

## 2021-06-07 MED ORDER — IBUPROFEN 600 MG PO TABS
600.0000 mg | ORAL_TABLET | Freq: Four times a day (QID) | ORAL | Status: DC
Start: 2021-06-07 — End: 2021-06-09
  Administered 2021-06-07 – 2021-06-09 (×6): 600 mg via ORAL
  Filled 2021-06-07 (×7): qty 1

## 2021-06-07 MED ORDER — FENTANYL CITRATE (PF) 100 MCG/2ML IJ SOLN
INTRAMUSCULAR | Status: DC | PRN
  Administered 2021-06-07: 100 ug via INTRAVENOUS

## 2021-06-07 MED ORDER — NALOXONE HCL 4 MG/10ML IJ SOLN
1.0000 ug/kg/h | INTRAVENOUS | Status: DC | PRN
  Filled 2021-06-07: qty 5

## 2021-06-07 MED ORDER — DIPHENHYDRAMINE HCL 50 MG/ML IJ SOLN: 12.5000 mg | INTRAMUSCULAR | Status: DC | PRN

## 2021-06-07 MED ORDER — ENOXAPARIN SODIUM 40 MG/0.4ML IJ SOSY
40.0000 mg | PREFILLED_SYRINGE | INTRAMUSCULAR | Status: DC
  Administered 2021-06-07 – 2021-06-08 (×2): 40 mg via SUBCUTANEOUS
  Filled 2021-06-07 (×2): qty 0.4

## 2021-06-07 MED ORDER — HYDROMORPHONE HCL 1 MG/ML IJ SOLN: 0.2500 mg | INTRAMUSCULAR | Status: DC | PRN

## 2021-06-07 MED ORDER — ONDANSETRON HCL 4 MG/2ML IJ SOLN: 4.0000 mg | Freq: Three times a day (TID) | INTRAMUSCULAR | Status: DC | PRN

## 2021-06-07 MED ORDER — ONDANSETRON HCL 4 MG/2ML IJ SOLN
4.0000 mg | Freq: Once | INTRAMUSCULAR | Status: AC | PRN
  Administered 2021-06-07: 4 mg via INTRAVENOUS

## 2021-06-07 MED ORDER — CHLOROPROCAINE HCL (PF) 3 % IJ SOLN
INTRAMUSCULAR | Status: DC | PRN
  Administered 2021-06-07: 5 mL via EPIDURAL
  Administered 2021-06-07: 10 mL via EPIDURAL

## 2021-06-07 MED ORDER — KETOROLAC TROMETHAMINE 30 MG/ML IJ SOLN
30.0000 mg | Freq: Four times a day (QID) | INTRAMUSCULAR | Status: DC
  Administered 2021-06-07 (×2): 30 mg via INTRAVENOUS
  Filled 2021-06-07 (×3): qty 1

## 2021-06-07 MED ORDER — AZITHROMYCIN 500 MG IV SOLR
INTRAVENOUS | Status: AC
  Filled 2021-06-07: qty 5

## 2021-06-07 MED ORDER — KETOROLAC TROMETHAMINE 30 MG/ML IJ SOLN
30.0000 mg | Freq: Four times a day (QID) | INTRAMUSCULAR | Status: DC | PRN
  Administered 2021-06-07: 30 mg via INTRAVENOUS

## 2021-06-07 MED ORDER — DIPHENHYDRAMINE HCL 25 MG PO CAPS: 25.0000 mg | ORAL_CAPSULE | Freq: Four times a day (QID) | ORAL | Status: DC | PRN

## 2021-06-07 MED ORDER — SENNOSIDES-DOCUSATE SODIUM 8.6-50 MG PO TABS
2.0000 | ORAL_TABLET | Freq: Every day | ORAL | Status: DC
  Administered 2021-06-08 – 2021-06-09 (×2): 2 via ORAL
  Filled 2021-06-07 (×2): qty 2

## 2021-06-07 MED ORDER — ACETAMINOPHEN 500 MG PO TABS
1000.0000 mg | ORAL_TABLET | Freq: Four times a day (QID) | ORAL | Status: AC
  Administered 2021-06-07 (×3): 1000 mg via ORAL
  Filled 2021-06-07 (×3): qty 2

## 2021-06-07 MED ORDER — NALOXONE HCL 0.4 MG/ML IJ SOLN: 0.4000 mg | INTRAMUSCULAR | Status: DC | PRN

## 2021-06-07 MED ORDER — MEASLES, MUMPS & RUBELLA VAC IJ SOLR
0.5000 mL | Freq: Once | INTRAMUSCULAR | Status: AC
  Administered 2021-06-09: 0.5 mL via SUBCUTANEOUS
  Filled 2021-06-07: qty 0.5

## 2021-06-07 MED ORDER — MEPERIDINE HCL 25 MG/ML IJ SOLN: 6.2500 mg | INTRAMUSCULAR | Status: DC | PRN

## 2021-06-07 MED ORDER — LIDOCAINE-EPINEPHRINE (PF) 2 %-1:200000 IJ SOLN
INTRAMUSCULAR | Status: AC
  Filled 2021-06-07: qty 20

## 2021-06-07 MED ORDER — ESCITALOPRAM OXALATE 10 MG PO TABS
10.0000 mg | ORAL_TABLET | Freq: Every day | ORAL | Status: DC
  Administered 2021-06-07 – 2021-06-08 (×2): 10 mg via ORAL
  Filled 2021-06-07 (×2): qty 1

## 2021-06-07 MED ORDER — CHLOROPROCAINE HCL (PF) 3 % IJ SOLN
INTRAMUSCULAR | Status: AC
  Filled 2021-06-07: qty 20

## 2021-06-07 MED ORDER — SCOPOLAMINE 1 MG/3DAYS TD PT72
MEDICATED_PATCH | TRANSDERMAL | Status: AC
  Filled 2021-06-07: qty 1

## 2021-06-07 MED ORDER — LACTATED RINGERS IV SOLN: INTRAVENOUS | Status: DC

## 2021-06-07 MED ORDER — COCONUT OIL OIL: 1.0000 "application " | TOPICAL_OIL | Status: DC | PRN

## 2021-06-07 MED ORDER — SCOPOLAMINE 1 MG/3DAYS TD PT72: 1.0000 | MEDICATED_PATCH | Freq: Once | TRANSDERMAL | Status: DC

## 2021-06-07 MED ORDER — KETOROLAC TROMETHAMINE 30 MG/ML IJ SOLN
INTRAMUSCULAR | Status: AC
  Filled 2021-06-07: qty 1

## 2021-06-07 MED ORDER — MEDROXYPROGESTERONE ACETATE 150 MG/ML IM SUSP: 150.0000 mg | INTRAMUSCULAR | Status: DC | PRN

## 2021-06-07 MED ORDER — MENTHOL 3 MG MT LOZG: 1.0000 | LOZENGE | OROMUCOSAL | Status: DC | PRN

## 2021-06-07 MED ORDER — SCOPOLAMINE 1 MG/3DAYS TD PT72
MEDICATED_PATCH | TRANSDERMAL | Status: DC | PRN
  Administered 2021-06-07: 1 via TRANSDERMAL

## 2021-06-07 MED ORDER — OXYCODONE HCL 5 MG PO TABS: 5.0000 mg | ORAL_TABLET | ORAL | Status: DC | PRN

## 2021-06-07 MED ORDER — AMISULPRIDE (ANTIEMETIC) 5 MG/2ML IV SOLN: 10.0000 mg | Freq: Once | INTRAVENOUS | Status: DC | PRN

## 2021-06-07 MED ORDER — STERILE WATER FOR IRRIGATION IR SOLN
Status: DC | PRN
  Administered 2021-06-07: 1000 mL

## 2021-06-07 MED ORDER — ACETAMINOPHEN 325 MG PO TABS: 650.0000 mg | ORAL_TABLET | ORAL | Status: DC | PRN

## 2021-06-07 MED ORDER — OXYCODONE HCL 5 MG/5ML PO SOLN: 5.0000 mg | Freq: Once | ORAL | Status: DC | PRN

## 2021-06-07 MED ORDER — OXYCODONE HCL 5 MG PO TABS: 5.0000 mg | ORAL_TABLET | Freq: Once | ORAL | Status: DC | PRN

## 2021-06-07 SURGICAL SUPPLY — 38 items
ADH SKN CLS APL DERMABOND .7 (GAUZE/BANDAGES/DRESSINGS)
APL SKNCLS STERI-STRIP NONHPOA (GAUZE/BANDAGES/DRESSINGS) ×1
BENZOIN TINCTURE PRP APPL 2/3 (GAUZE/BANDAGES/DRESSINGS) ×3 IMPLANT
CHLORAPREP W/TINT 26ML (MISCELLANEOUS) ×6 IMPLANT
CLAMP CORD UMBIL (MISCELLANEOUS) ×3 IMPLANT
CLOSURE STERI STRIP 1/2 X4 (GAUZE/BANDAGES/DRESSINGS) ×1 IMPLANT
CLOTH BEACON ORANGE TIMEOUT ST (SAFETY) ×3 IMPLANT
DERMABOND ADVANCED (GAUZE/BANDAGES/DRESSINGS)
DERMABOND ADVANCED .7 DNX12 (GAUZE/BANDAGES/DRESSINGS) IMPLANT
DRSG OPSITE POSTOP 4X10 (GAUZE/BANDAGES/DRESSINGS) ×3 IMPLANT
ELECT REM PT RETURN 9FT ADLT (ELECTROSURGICAL) ×2
ELECTRODE REM PT RTRN 9FT ADLT (ELECTROSURGICAL) ×2 IMPLANT
EXTRACTOR VACUUM KIWI (MISCELLANEOUS) IMPLANT
GLOVE BIOGEL PI IND STRL 7.0 (GLOVE) ×6 IMPLANT
GLOVE BIOGEL PI INDICATOR 7.0 (GLOVE) ×3
GLOVE ECLIPSE 6.5 STRL STRAW (GLOVE) ×3 IMPLANT
GOWN STRL REUS W/TWL LRG LVL3 (GOWN DISPOSABLE) ×9 IMPLANT
KIT ABG SYR 3ML LUER SLIP (SYRINGE) IMPLANT
NDL HYPO 25X5/8 SAFETYGLIDE (NEEDLE) IMPLANT
NEEDLE HYPO 25X5/8 SAFETYGLIDE (NEEDLE) IMPLANT
NS IRRIG 1000ML POUR BTL (IV SOLUTION) ×3 IMPLANT
PACK C SECTION WH (CUSTOM PROCEDURE TRAY) ×3 IMPLANT
PAD ABD 7.5X8 STRL (GAUZE/BANDAGES/DRESSINGS) ×3 IMPLANT
PAD OB MATERNITY 4.3X12.25 (PERSONAL CARE ITEMS) ×3 IMPLANT
RTRCTR C-SECT PINK 25CM LRG (MISCELLANEOUS) ×3 IMPLANT
STRIP CLOSURE SKIN 1/2X4 (GAUZE/BANDAGES/DRESSINGS) ×3 IMPLANT
SUT PLAIN 0 NONE (SUTURE) IMPLANT
SUT PLAIN 2 0 XLH (SUTURE) IMPLANT
SUT VIC AB 0 CT1 27 (SUTURE) ×4
SUT VIC AB 0 CT1 27XBRD ANBCTR (SUTURE) ×4 IMPLANT
SUT VIC AB 0 CTX 36 (SUTURE) ×6
SUT VIC AB 0 CTX36XBRD ANBCTRL (SUTURE) ×6 IMPLANT
SUT VIC AB 2-0 CT1 27 (SUTURE) ×2
SUT VIC AB 2-0 CT1 TAPERPNT 27 (SUTURE) ×2 IMPLANT
SUT VIC AB 4-0 KS 27 (SUTURE) ×3 IMPLANT
TOWEL OR 17X24 6PK STRL BLUE (TOWEL DISPOSABLE) ×3 IMPLANT
TRAY FOLEY W/BAG SLVR 14FR LF (SET/KITS/TRAYS/PACK) IMPLANT
WATER STERILE IRR 1000ML POUR (IV SOLUTION) ×3 IMPLANT

## 2021-06-07 NOTE — Social Work (Signed)
MOB was referred for history of anxiety. ? ?* Referral screened out by Clinical Social Worker because none of the following criteria appear to apply: ?~ History of anxiety/depression during this pregnancy, or of post-partum depression following prior delivery. ?~ Diagnosis of anxiety and/or depression within last 3 years ?OR ?* MOB's symptoms currently being treated with medication and/or therapy. Per chart review, MOB takes Lexapro and Hydroxyzine PRN for anxiety.  ? ?Please contact the Clinical Social Worker if needs arise, by [facility] request, or if MOB scores greater than 9/yes to question 10 on Edinburgh Postpartum Depression Screen.  ? ?Kathrin Greathouse, MSW, LCSW ?[facility] [place]  ?Clinical Social Worker  ?[phone] ?06/07/2021  11:53 AM  ?

## 2021-06-07 NOTE — Lactation Note (Signed)
This note was copied from a baby's chart. ?Lactation Consultation Note ? ?Patient Name: Girl Jessa Stinson ?Today's Date: 06/07/2021 ?Reason for consult: Initial assessment;Primapara;1st time breastfeeding;Term;Other (Comment) (Baby is 8 hours old, per mom last fed at [phone] for 15 mins, per mom swallows. baby is asleep. LC reviewed 24 hours feeding goals - feed with feeding cues ( 8-12 times in 24 hours ). LC encouraged  mom to call with feeding cues.) ?Age:36 hours ? ?Maternal Data - per mom has been hand expressing at home.  ?Does the patient have breastfeeding experience prior to this delivery?: No ? ?Feeding ?Mother's Current Feeding Choice: Breast Milk ? ?LATCH Score ?  ? ?  ? ?  ? ?  ? ?  ? ?  ? ? ?Lactation Tools Discussed/Used ?  ? ?Interventions ?Interventions: Breast feeding basics reviewed;Education;LC Services brochure ? ?Discharge ?Pump: DEBP;Manual;Personal ?WIC Program: No ? ?Consult Status ?Consult Status: Follow-up ?Date: 06/07/21 ?Follow-up type: In-patient ? ? ? ?Jerlyn Ly Balian Schaller ?06/07/2021, 8:29 AM ? ? ? ?

## 2021-06-07 NOTE — Anesthesia Postprocedure Evaluation (Signed)
Anesthesia Post Note ? ?Patient: Danielle Perkins ? ?Procedure(s) Performed: CESAREAN SECTION ? ?  ? ?Patient location during evaluation: PACU ?Anesthesia Type: Epidural ?Level of consciousness: awake and alert and oriented ?Pain management: pain level controlled ?Vital Signs Assessment: post-procedure vital signs reviewed and stable ?Respiratory status: spontaneous breathing, nonlabored ventilation and respiratory function stable ?Cardiovascular status: blood pressure returned to baseline and stable ?Postop Assessment: no headache, no backache, patient able to bend at knees and epidural receding ?Anesthetic complications: no ? ? ?No notable events documented. ? ?Last Vitals:  ?Vitals:  ? 06/07/21 0125 06/07/21 0132  ?BP:  (!) 95/55  ?Pulse:  (!) 121  ?Resp:  (!) 21  ?Temp: (!) 36.3 ?C   ?SpO2:  100%  ?  ?Last Pain:  ?Vitals:  ? 06/07/21 0125  ?TempSrc: Oral  ?PainSc:   ? ?Pain Goal:   ? ?LLE Motor Response: Purposeful movement (06/07/21 0125) ?LLE Sensation: Tingling (06/07/21 0125) ?RLE Motor Response: Purposeful movement (06/07/21 0125) ?RLE Sensation: Tingling (06/07/21 0125) ?  ?  ?Epidural/Spinal Function Cutaneous sensation: Tingles (06/07/21 0125), Patient able to flex knees: Yes (06/07/21 0125), Patient able to lift hips off bed: No (06/07/21 0125), Back pain beyond tenderness at insertion site: No (06/07/21 0125), Progressively worsening motor and/or sensory loss: No (06/07/21 0125), Bowel and/or bladder incontinence post epidural: No (06/07/21 0125) ? ?Danielle Perkins ? ? ? ? ?

## 2021-06-07 NOTE — Transfer of Care (Signed)
Immediate Anesthesia Transfer of Care Note ? ?Patient: Danielle Perkins ? ?Procedure(s) Performed: CESAREAN SECTION ? ?Patient Location: PACU ? ?Anesthesia Type:Epidural ? ?Level of Consciousness: awake, alert  and patient cooperative ? ?Airway & Oxygen Therapy: Patient Spontanous Breathing ? ?Post-op Assessment: Report given to RN and Post -op Vital signs reviewed and stable ? ?Post vital signs: Reviewed and stable ? ?Last Vitals:  ?Vitals Value Taken Time  ?BP 190/134 06/07/21 0054  ?Temp    ?Pulse 113 06/07/21 0057  ?Resp    ?SpO2 90 % 06/07/21 0057  ?Vitals shown include unvalidated device data. ? ?Last Pain:  ?Vitals:  ? 06/06/21 2210  ?TempSrc:   ?PainSc: 5   ?   ? ?  ? ?Complications: No notable events documented. Patient shivering. ?

## 2021-06-07 NOTE — Op Note (Signed)
PreOp Diagnosis: 1) Intrauterine pregnancy @ [redacted]w[redacted]d?2) Cord prolapse ?3) Non-reassuring fetal heart tones ?PostOp Diagnosis: same ?Procedure: primary LTCS ?Surgeon: Dr. JJanyth Pupa?Assistant: Max Rerkpattanapipat, MS-3 ?Anesthesia: epidural ?Complications: none ?EBL: 471cc ?UOP: 150cc ?Fluids: 1000cc ? ?INDICATIONS: Cord prolapse with non-reassuring fetal heart tones. ? ?Findings: Female infant from vertex presentation, loose nuchal cord.  Normal uterus, tubes and ovaries.  Right para-tubal cyst noted. ? ?PROCEDURE:  ?Informed consent was obtained from the patient with risks, benefits, complications, treatment options, and expected outcomes discussed with the patient.  The patient concurred with the proposed plan, giving informed consent with form signed.  ? ?The patient was taken to Operating Room, and identified with the procedure verified as C-Section Delivery with Time Out. With induction of anesthesia, the patient was prepped and draped in the usual sterile fashion. A Pfannenstiel incision was made and carried down through the subcutaneous tissue to the fascia. The fascia was incised in the midline and extended transversely. The rectus muscles were bluntly separated.  The peritoneum was identified and entered. Peritoneal incision was extended bluntly. Alexis retractor was placed.  A low transverse uterine incision was made and the infants head delivered atraumatically with the assistance of a Kiwi. After the umbilical cord was clamped and cut cord blood was obtained for evaluation. ? ? The placenta was removed intact and appeared normal. The uterine outline, tubes and ovaries appeared normal. The uterine incision was closed with running locked sutures of 0 Vicryl and a second layer of the same stitch was used in an imbricating fashion.  Excellent hemostasis was obtained. Alexis retractor was removed.  Peritoneum was closed in a running fashion. The fascia was then reapproximated with running sutures of 0  Vicryl. The subcutaneous tissue was reapproximated with 2-0 plain gut suture.  The skin was closed with 4-0 vicryl in a subcuticular fashion. ? ?Instrument, sponge, and needle counts were correct prior the abdominal closure and at the conclusion of the case. The patient was taken to recovery in stable condition. ? ?JJanyth Pupa DO ?Attending [facility] ?[facility]? ? ?

## 2021-06-07 NOTE — Progress Notes (Signed)
Patient ID: Danielle Perkins, female   DOB: [birth year], 26 y.o.   MRN: [id number] ?Called to room by RN who states she had checked the patient after SROM and felt a cord come out in her hand ? ?Preparations made for Stat Cesarean Delivery ? ?FHR recovered from 60s to mid 90s by the time we got to OR ? ?Dr Nelda Marseille will meet [place] in OR ? ? ? ?

## 2021-06-07 NOTE — Discharge Summary (Signed)
? ?  Postpartum Discharge Summary ?   ?Patient Name: Danielle Perkins ?DOB: 1995-04-25 ?MRN: [id number] ? ?Date of admission: 06/06/2021 ?Delivery date:06/07/2021  ?Delivering provider:   ?Date of discharge: 06/09/2021 ? ?Admitting diagnosis: Encounter for elective induction of labor [Z34.90] ?Intrauterine pregnancy: [redacted]w[redacted]d    ?Secondary diagnosis:  Principal Problem: ?  Encounter for elective induction of labor ?Active Problems: ?  Supervision of low-risk pregnancy, unspecified trimester ?Cord prolapse ? ?Additional problems: N/A    ?Discharge diagnosis: Term Pregnancy Delivered                                              ?Post partum procedures: N/A ?Augmentation: Pitocin, Cytotec, and IP Foley ?Complications: None ? ?Hospital course: Onset of Labor With Unplanned C/S   ?26y.o. yo G2P0010 at 363w1das admitted in Latent Labor on 06/06/2021. Patient had a labor course significant for cord prolapse. The patient went for cesarean section due to Cord Prolapse. Delivery details as follows: ?Membrane Rupture Time/Date: 11:00 PM ,06/06/2021   ?Delivery Method:C-Section, Low Transverse  ?Details of operation can be found in separate operative note. Patient had an uncomplicated postpartum course.  She is ambulating,tolerating a regular diet, passing flatus, and urinating well.  Patient is discharged home in stable condition 06/09/21. ? ?Newborn Data: ?Birth date:06/07/2021  ?Birth time:12:11 AM  ?Gender:Female  ?Living status:Living  ?Apgars:8 ,9  ?[id number]  ? ?Magnesium Sulfate received: No ?BMZ received: No ?Rhophylac:No ?MMR:N/A ?T-DaP:Given prenatally ?Flu: Given prenatally ?Transfusion:No ? ?Physical exam  ?Vitals:  ? 06/06/21 2350 06/06/21 2351 06/06/21 2355 06/07/21 0000  ?BP:  130/73  (!) 149/68  ?Pulse:  85  (!) 132  ?Resp:      ?Temp:      ?TempSrc:      ?SpO2: 99%  100%   ?Weight:      ?Height:      ? ?General: alert, cooperative, and no distress ?Lochia: appropriate ?Uterine Fundus: firm ?Incision: Scant dried  and marked drainage on Honeycomb ?DVT Evaluation: No evidence of DVT seen on physical exam. ?Labs: ?Lab Results  ?Component Value Date  ? WBC 8.3 06/06/2021  ? HGB 10.8 (L) 06/06/2021  ? HCT 34.0 (L) 06/06/2021  ? MCV 84.0 06/06/2021  ? PLT 198 06/06/2021  ? ? ?  Latest Ref Rng & Units 05/20/2021  ?  5:06 PM  ?CMP  ?Glucose 70 - 99 mg/dL 103    ?BUN 6 - 20 mg/dL 5    ?Creatinine 0.44 - 1.00 mg/dL 0.48    ?Sodium 135 - 145 mmol/L 134    ?Potassium 3.5 - 5.1 mmol/L 4.0    ?Chloride 98 - 111 mmol/L 106    ?CO2 22 - 32 mmol/L 19    ?Calcium 8.9 - 10.3 mg/dL 8.8    ?Total Protein 6.5 - 8.1 g/dL 6.6    ?Total Bilirubin 0.3 - 1.2 mg/dL 0.3    ?Alkaline Phos 38 - 126 U/L 163    ?AST 15 - 41 U/L 28    ?ALT 0 - 44 U/L 38    ? ?Edinburgh Score: ?   ? View : No data to display.  ?  ?  ?  ? ? ? ?After visit meds:  ?Allergies as of 06/09/2021   ?No Known Allergies ?  ? ?  ?Medication List  ?  ? ?TAKE these medications   ? ?  acetaminophen 325 MG tablet ?Commonly known as: TYLENOL ?Take 2 tablets (650 mg total) by mouth every 4 (four) hours as needed for mild pain (temperature > 101.5.). ?What changed:  ?medication strength ?how much to take ?when to take this ?reasons to take this ?  ?escitalopram 10 MG tablet ?Commonly known as: LEXAPRO ?Take 1 tablet by mouth daily. ?  ?famotidine 20 MG tablet ?Commonly known as: Pepcid ?Take 1 tablet (20 mg total) by mouth daily. ?  ?ferrous sulfate 325 (65 FE) MG tablet ?Take 325 mg by mouth daily with breakfast. ?  ?hydrOXYzine 25 MG capsule ?Commonly known as: Vistaril ?Take 1 capsule (25 mg total) by mouth 3 (three) times daily as needed for anxiety or itching. ?  ?ibuprofen 600 MG tablet ?Commonly known as: ADVIL ?Take 1 tablet (600 mg total) by mouth every 6 (six) hours. ?  ?multivitamin-prenatal 27-0.8 MG Tabs tablet ?Take 1 tablet by mouth daily at 12 noon. ?  ?oxyCODONE 5 MG immediate release tablet ?Commonly known as: Oxy IR/ROXICODONE ?Take 1 tablet (5 mg total) by mouth every 4 (four)  hours as needed for up to 3 days for moderate pain. ?  ? ?  ? ? ? ?Discharge home in stable condition ?Infant Feeding: Breast ?Infant Disposition:home with mother ?Discharge instruction: per After Visit Summary and Postpartum booklet. ?Activity: Advance as tolerated. Pelvic rest for 6 weeks.  ?Diet: routine diet ?Future Appointments: ?Future Appointments  ?Date Time Provider Vincent  ?07/16/2021  3:15 PM Clarnce Flock, MD [facility]  ? ?Follow up Visit: ? ?Message sent to Mom-baby Dyad pool by Dr Higinio Plan on 4/22:  ?This patient is a Mom+Baby Combined care infant. Blen needs the follow appointments scheduled ? ?Infant needs a newborn weight check -- 2-3 days from discharge. Approximate date 4/24 or 4/25 (Mon/Tues)  ?Infant needs a 2 week weight check  ?Infant needs 1 month Well Child Check ? ?Please schedule this patient for a In person postpartum visit in 6 weeks with the following provider:  FM OB . ?Additional Postpartum F/U: None   ?Low risk pregnancy complicated by:  spontaneous cord prolapse  ?Delivery mode:  C-Section, Low Transverse  ?Anticipated Birth Control:  IUD at postpartum visit  ? ?Mallie Snooks, MSA, MSN, CNM ?Certified Nurse Midwife, [place] ?[facility] [place] ? ? ? ? ?

## 2021-06-08 MED ORDER — FERROUS FUMARATE 324 (106 FE) MG PO TABS
1.0000 | ORAL_TABLET | ORAL | Status: DC
  Administered 2021-06-08: 106 mg via ORAL
  Filled 2021-06-08: qty 1

## 2021-06-08 NOTE — Lactation Note (Signed)
This note was copied from a baby's chart. ?Lactation Consultation Note ? ?Patient Name: Danielle Perkins ?Today's Date: 06/08/2021 ?Reason for consult: Follow-up assessment;1st time breastfeeding;Primapara;Term ?Age:26 hours ? ?LC in to visit with P75 Mom of term baby delivered by emergency C/S for prolapse of umbilical cord.  Baby at 5.9% weight loss with great output.  Bilirubin ok ? ?Mom had been latching and attempting to feed and spoon feeding colostrum.  Her RN assisted her to latch deeply this am, and baby fed with consistent swallowing for 20 mins.  Baby currently sleeping STS on Mom.   ? ?Family arrived to room.  Mom to call out for assistance prn.  Mom reports no pain with latch, but feeling a strong tug with sucking.  ? ?Encouraged continued STS and offering the breast with cues. ? ?LATCH Score ?Latch: Grasps breast easily, tongue down, lips flanged, rhythmical sucking. ? ?Audible Swallowing: Spontaneous and intermittent ? ?Type of Nipple: Everted at rest and after stimulation ? ?Comfort (Breast/Nipple): Soft / non-tender ? ?Hold (Positioning): Assistance needed to correctly position infant at breast and maintain latch. ? ?LATCH Score: 9 ? ?Interventions ?Interventions: Breast feeding basics reviewed;Skin to skin;Breast massage;Hand express ? ?Consult Status ?Consult Status: Follow-up ?Date: 06/09/21 ?Follow-up type: In-patient ? ? ? ?Tilda Burrow E ?06/08/2021, 12:21 PM ? ? ? ?

## 2021-06-08 NOTE — Lactation Note (Signed)
This note was copied from a baby's chart. ?Lactation Consultation Note ? ?Patient Name: Danielle Perkins ?Today's Date: 06/08/2021 ?Reason for consult: Follow-up assessment;1st time breastfeeding;Primapara;Term ?Age:26 hours ? ?Mom requested assistance with positioning and latching baby in cross cradle hold on right breast.  Mom needing much guidance on breast support and supporting baby's head.  After several attempts, LC assisted Mom to latch baby in football hold for a deeper latch to the breast.  FOB demonstrated the chin tug and flanged lower lip.  Baby's lip was tucked and Mom felt increased comfort following this.  ? ?Baby noted to be sucking consistently and vigorously and swallowing identified. ? ?Encouraged continued STS and offering the breast often with cues. ? ? ?LATCH Score ?Latch: Grasps breast easily, tongue down, lips flanged, rhythmical sucking. ? ?Audible Swallowing: Spontaneous and intermittent ? ?Type of Nipple: Everted at rest and after stimulation ? ?Comfort (Breast/Nipple): Soft / non-tender ? ?Hold (Positioning): Assistance needed to correctly position infant at breast and maintain latch. ? ?LATCH Score: 9 ? ?Interventions ?Interventions: Breast feeding basics reviewed;Assisted with latch;Skin to skin;Breast massage;Hand express;Breast compression;Adjust position;Support pillows;Position options;Expressed milk;Education ?Consult Status ?Consult Status: Follow-up ?Date: 06/09/21 ?Follow-up type: In-patient ? ? ? ?Tilda Burrow E ?06/08/2021, 2:21 PM ? ? ? ?

## 2021-06-08 NOTE — Progress Notes (Signed)
POSTPARTUM PROGRESS NOTE ? ?Post Op Day 1 ? ?Subjective: ? ?Danielle Perkins is a 27 y.o. G2P1011 s/p STAT C-section for prolapsed cord at 37w1dafter elective IOL.  No acute events overnight.  Pt denies problems with ambulating, voiding, or po intake.  She denies nausea or vomiting.  Pain is well controlled.  She has had flatus. She has not had bowel movement.  Lochia Minimal.  ? ?Objective: ?Blood pressure 107/70, pulse 81, temperature 98.1 ?F (36.7 ?C), temperature source Oral, resp. rate 18, height '5\' 3"'$  (1.6 m), weight 97.9 kg, last menstrual period 09/06/2020, SpO2 98 %, unknown if currently breastfeeding. ? ?Physical Exam:  ?General: alert, cooperative, and no distress ?Chest: no respiratory distress, CTAB ?Heart:normal rate, regular rhythm, no murmurs ?Abdomen: soft, nontender ?Uterine Fundus: firm, appropriately tender ?DVT Evaluation: No calf swelling or tenderness ?Extremities: No edema ?Skin: warm, dry; incision clean/dry/intact ? ?Recent Labs  ?  06/06/21 ?1051 06/07/21 ?01224 ?HGB 10.8* 9.3*  ?HCT 34.0* 28.8*  ? ? ?Assessment/Plan: ?Danielle ROTUNDOis a 26y.o. G2P1011 s/p STAT C-section for prolapsed cord at 360w1dfter elective IOL. ? ?POD#1 - Doing well, ambulating well, pain controlled. Incision dry, clean, intact. ?Contraception: IUD ?Feeding: Breast ?Dispo: Plan for discharge POD 2. ? ?#Rhogam neg status: Patient and baby are both rhogam neg ? ? LOS: 2 days  ? ?Victoriano Campion, Medical Student,  ?06/08/2021, 7:42 AM   ?

## 2021-06-09 MED ORDER — ACETAMINOPHEN 325 MG PO TABS: 650.0000 mg | ORAL_TABLET | ORAL | 0 refills | Status: AC | PRN

## 2021-06-09 MED ORDER — IBUPROFEN 600 MG PO TABS: 600.0000 mg | ORAL_TABLET | Freq: Four times a day (QID) | ORAL | 0 refills | Status: DC

## 2021-06-09 MED ORDER — OXYCODONE HCL 5 MG PO TABS: 5.0000 mg | ORAL_TABLET | ORAL | 0 refills | Status: AC | PRN

## 2021-06-09 NOTE — Plan of Care (Signed)
?  Problem: Education: ?Goal: Knowledge of General Education information will improve ?Description: Including pain rating scale, medication(s)/side effects and non-pharmacologic comfort measures ?Outcome: Completed/Met ?  ?Problem: Clinical Measurements: ?Goal: Ability to maintain clinical measurements within normal limits will improve ?Outcome: Completed/Met ?Goal: Will remain free from infection ?Outcome: Completed/Met ?Goal: Diagnostic test results will improve ?Outcome: Completed/Met ?Goal: Respiratory complications will improve ?Outcome: Completed/Met ?Goal: Cardiovascular complication will be avoided ?Outcome: Completed/Met ?  ?Problem: Activity: ?Goal: Risk for activity intolerance will decrease ?Outcome: Completed/Met ?  ?Problem: Pain Managment: ?Goal: General experience of comfort will improve ?Outcome: Completed/Met ?  ?Problem: Safety: ?Goal: Ability to remain free from injury will improve ?Outcome: Completed/Met ?  ?Problem: Education: ?Goal: Knowledge of condition will improve ?Outcome: Completed/Met ?  ?Problem: Activity: ?Goal: Will verbalize the importance of balancing activity with adequate rest periods ?Outcome: Completed/Met ?Goal: Ability to tolerate increased activity will improve ?Outcome: Completed/Met ?  ?Problem: Life Cycle: ?Goal: Chance of risk for complications during the postpartum period will decrease ?Outcome: Completed/Met ?  ?Problem: Role Relationship: ?Goal: Ability to demonstrate positive interaction with newborn will improve ?Outcome: Completed/Met ?  ?

## 2021-06-10 ENCOUNTER — Encounter (HOSPITAL_COMMUNITY): Payer: Self-pay | Admitting: Obstetrics & Gynecology

## 2021-06-10 NOTE — Addendum Note (Signed)
Addendum  created 06/10/21 1652 by Pervis Hocking, DO  ? Intraprocedure Event edited  ?  ?

## 2021-06-13 ENCOUNTER — Ambulatory Visit: Payer: Medicaid Other

## 2021-06-13 NOTE — Progress Notes (Signed)
Pt here today for wound check. Pt had primary C-section on 06/07/2021.  ?Pt denies any drainage, redness or fever to site.  ? ?Incision today is clean, dry, intact and well approximated. Pt had honeycomb dressing and Steri-strips that were removed yesterday at home. Pt tolerated well.  ? ?Pt advised to keep clean and dry. Pt verbalized understanding.  ? ?Pt has scheduled PP visit on 07/16/2021.  ? ?Colletta Maryland, RN   ?

## 2021-06-14 ENCOUNTER — Ambulatory Visit: Payer: Medicaid Other

## 2021-07-16 ENCOUNTER — Ambulatory Visit (INDEPENDENT_AMBULATORY_CARE_PROVIDER_SITE_OTHER): Payer: Medicaid Other | Admitting: Family Medicine

## 2021-07-16 ENCOUNTER — Encounter: Payer: Self-pay | Admitting: Family Medicine

## 2021-07-16 DIAGNOSIS — Z30430 Encounter for insertion of intrauterine contraceptive device: Secondary | ICD-10-CM

## 2021-07-16 MED ORDER — LEVONORGESTREL 20.1 MCG/DAY IU IUD
1.0000 | INTRAUTERINE_SYSTEM | Freq: Once | INTRAUTERINE | Status: AC
  Administered 2021-07-16: 1 via INTRAUTERINE

## 2021-07-16 NOTE — Progress Notes (Signed)
    GYNECOLOGY OFFICE PROCEDURE NOTE  Danielle Perkins is a 26 y.o. G2P1011 here for [place] IUD insertion. No GYN concerns.  Last pap smear:  Lab Results  Component Value Date   DIAGPAP  12/18/2020    - Negative for intraepithelial lesion or malignancy (NILM)    Urine pregnancy test: deferred, postpartum and no intercourse since delivery  IUD Insertion Procedure Note Patient identified, informed consent performed, consent signed.   Discussed risks of irregular bleeding, increased cramping, infection, malpositioning or misplacement of the IUD outside the uterus which may require further procedure such as laparoscopy. Also discussed >99% contraception efficacy, increased risk of ectopic pregnancy with failure of method.  Time out was performed.  Speculum placed in the vagina.  Cervix visualized.  Cleaned with Betadine x 2.  Grasped anteriorly with a single tooth tenaculum.  Uterus sounded to 9 cm. IUD placed per manufacturer's recommendations.  Strings trimmed to 5 cm. Tenaculum was removed, good hemostasis noted.  Patient tolerated procedure well.   Patient was given post-procedure instructions.  She was advised to have backup contraception for one week.  Patient was also asked to check IUD strings periodically and follow up in 4 weeks for IUD check.  Clarnce Flock, MD/MPH Attending Family Medicine Physician, [facility] for [facility], [place]

## 2021-07-16 NOTE — Progress Notes (Signed)
[place] Partum Visit Note  Danielle Perkins is a 26 y.o. G23P1011 female who presents for a postpartum visit. She is 4 weeks postpartum following a primary cesarean section.  I have fully reviewed the prenatal and intrapartum course. The delivery was at 39.1 gestational weeks.  Anesthesia: epidural. Postpartum course has been uneventful. Baby is doing well. Baby is feeding by breast. Bleeding no bleeding. Bowel function is normal. Bladder function is normal. Patient is not sexually active. Contraception method is IUD. Postpartum depression screening: negative.   The pregnancy intention screening data noted above was reviewed. Potential methods of contraception were discussed. The patient elected to proceed with No data recorded.   Edinburgh Postnatal Depression Scale - 07/16/21 1528       Edinburgh Postnatal Depression Scale:  In the Past 7 Days   I have been able to laugh and see the funny side of things. 0    I have looked forward with enjoyment to things. 0    I have blamed myself unnecessarily when things went wrong. 1    I have been anxious or worried for no good reason. 2    I have felt scared or panicky for no good reason. 0    Things have been getting on top of me. 0    I have been so unhappy that I have had difficulty sleeping. 0    I have felt sad or miserable. 0    I have been so unhappy that I have been crying. 0    The thought of harming myself has occurred to me. 0    Edinburgh Postnatal Depression Scale Total 3             Health Maintenance Due  Topic Date Due   COVID-19 Vaccine (3 - Booster for Pfizer series) 07/08/2019    The following portions of the patient's history were reviewed and updated as appropriate: allergies, current medications, past family history, past medical history, past social history, past surgical history, and problem list.  Review of Systems Pertinent items noted in HPI and remainder of comprehensive ROS otherwise negative.  Objective:   BP 103/68   Pulse 96   Ht '5\' 3"'$  (1.6 m)   Wt 202 lb (91.6 kg)   LMP 07/13/2021 (Exact Date)   Breastfeeding Yes   BMI 35.78 kg/m    General:  alert, cooperative, and appears stated age   Breasts:  not indicated  Lungs: Comfortable on room air  GU exam:  normal       Assessment:    There are no diagnoses linked to this encounter.  Normal postpartum exam.   Plan:   Essential components of care per ACOG recommendations:  1.  Mood and well being: Patient with negative depression screening today. Reviewed local resources for support.  - Patient tobacco use? No.   - hx of drug use? No.    2. Infant care and feeding:  -Patient currently breastmilk feeding? Yes. Reviewed importance of draining breast regularly to support lactation.  -Social determinants of health (SDOH) reviewed in EPIC. No concerns  3. Sexuality, contraception and birth spacing - Patient does not want a pregnancy in the next year.  Desired family size is unsure number of children.  - Reviewed reproductive life planning. Reviewed contraceptive methods based on pt preferences and effectiveness.  Patient desired IUD or IUS today.   - Discussed birth spacing of 18 months  4. Sleep and fatigue -Encouraged family/partner/community support of 4 hrs of  uninterrupted sleep to help with mood and fatigue  5. Physical Recovery  - Discussed patients delivery and complications. She describes her labor as mixed. - Patient had a C-section emergent.  - Patient has urinary incontinence? No. - Patient is safe to resume physical and sexual activity  6.  Health Maintenance - HM due items addressed No - up to date - Last pap smear  Diagnosis  Date Value Ref Range Status  12/18/2020   Final   - Negative for intraepithelial lesion or malignancy (NILM)   Pap smear not done at today's visit.  -Breast Cancer screening indicated? No.   7. Chronic Disease/Pregnancy Condition follow up: None  - PCP follow up  Clarnce Flock, MD [facility] [place], [place]

## 2021-08-08 ENCOUNTER — Encounter: Payer: Self-pay | Admitting: Family Medicine

## 2021-08-08 ENCOUNTER — Other Ambulatory Visit: Payer: Self-pay

## 2021-08-08 ENCOUNTER — Ambulatory Visit (INDEPENDENT_AMBULATORY_CARE_PROVIDER_SITE_OTHER): Payer: Medicaid Other | Admitting: Family Medicine

## 2021-08-08 VITALS — BP 123/84 | HR 82 | Wt 201.9 lb

## 2021-08-08 DIAGNOSIS — Z975 Presence of (intrauterine) contraceptive device: Secondary | ICD-10-CM | POA: Insufficient documentation

## 2021-08-08 DIAGNOSIS — Z30431 Encounter for routine checking of intrauterine contraceptive device: Secondary | ICD-10-CM

## 2021-08-08 NOTE — Progress Notes (Signed)
Patient in for string check, states she has had no concerns since getting IUD placed. Has been experiencing bleeding which patient acknowledges is normal. States she has been doing self string checks with no issues and partner has not had any issues. Currently bleeding so requesting string check not be completed, voiced understanding if any concerns she can call us for a check.   No other issues or concerns at this time.  Altha Harm, CMA

## 2021-08-08 NOTE — Progress Notes (Signed)
   GYNECOLOGY PROBLEM  VISIT ENCOUNTER NOTE  Subjective:   Danielle Perkins is a 26 y.o. G46P1011 female here for a problem GYN visit.  Current complaints: string check. Able to feel strings and partner cannot feel them during sex. Patient is still breastfeeding.   Denies abnormal vaginal bleeding, discharge, pelvic pain, problems with intercourse or other gynecologic concerns.     Gynecologic History Patient's last menstrual period was 07/13/2021 (exact date).  Contraception: IUD  Health Maintenance Due  Topic Date Due   COVID-19 Vaccine (3 - Pfizer risk series) 06/10/2019    The following portions of the patient's history were reviewed and updated as appropriate: allergies, current medications, past family history, past medical history, past social history, past surgical history and problem list.  Review of Systems Pertinent items are noted in HPI.   Objective:  BP 123/84   Pulse 82   Wt 201 lb 14.4 oz (91.6 kg)   LMP 07/13/2021 (Exact Date)   Breastfeeding Yes   BMI 35.76 kg/m  Gen: well appearing, NAD HEENT: no scleral icterus CV: RR Lung: Normal WOB Ext: warm well perfused  PELVIC: patient declined   Assessment and Plan:   #IUD check - declined exam but feels strings and comfortable with that Reviewed yearly visit to visualize strings or if she cannot feel  #Oversupply BM supply has down regulated and is meeting needs of infant. No concerns for mastitis  Please refer to After Visit Summary for other counseling recommendations.   Return if symptoms worsen or fail to improve.  Caren Macadam, MD, MPH, ABFM Attending [place] for [facility]

## 2021-09-17 ENCOUNTER — Encounter: Payer: Self-pay | Admitting: Family Medicine

## 2021-11-30 ENCOUNTER — Encounter (HOSPITAL_COMMUNITY): Payer: Self-pay

## 2021-11-30 ENCOUNTER — Ambulatory Visit (HOSPITAL_COMMUNITY)
Admission: EM | Admit: 2021-11-30 | Discharge: 2021-11-30 | Disposition: A | Discharge status: Home / Self Care | Payer: Medicaid Other | Attending: Physician Assistant | Admitting: Physician Assistant

## 2021-11-30 DIAGNOSIS — R051 Acute cough: Secondary | ICD-10-CM

## 2021-11-30 DIAGNOSIS — J40 Bronchitis, not specified as acute or chronic: Secondary | ICD-10-CM | POA: Diagnosis not present

## 2021-11-30 MED ORDER — ALBUTEROL SULFATE HFA 108 (90 BASE) MCG/ACT IN AERS
INHALATION_SPRAY | RESPIRATORY_TRACT | Status: AC
  Filled 2021-11-30: qty 6.7

## 2021-11-30 MED ORDER — HYDROCODONE BIT-HOMATROP MBR 5-1.5 MG/5ML PO SOLN: 5.0000 mL | Freq: Four times a day (QID) | ORAL | 0 refills | Status: AC | PRN

## 2021-11-30 MED ORDER — AZITHROMYCIN 250 MG PO TABS: 250.0000 mg | ORAL_TABLET | Freq: Every day | ORAL | 0 refills | Status: AC

## 2021-11-30 MED ORDER — ALBUTEROL SULFATE HFA 108 (90 BASE) MCG/ACT IN AERS
2.0000 | INHALATION_SPRAY | Freq: Once | RESPIRATORY_TRACT | Status: AC
  Administered 2021-11-30: 2 via RESPIRATORY_TRACT

## 2021-11-30 NOTE — Discharge Instructions (Signed)
Advise use the albuterol inhaler, 2 puffs every 6 hours on a regular basis to help decrease cough, wheezing and shortness of breath. Advise use the Hycodan cough syrup 1 to 2 teaspoons every 6-8 hours only when at home as this may cause drowsiness.  You can use Mucinex DM during the daytime to help control the cough. Advised take the Zithromax, 2 tablets initially and then 1 daily until completed. Advised to follow-up with PCP or return to urgent care if symptoms fail to improve.

## 2021-11-30 NOTE — ED Triage Notes (Signed)
Cough ongoing 2 and a half week. Has been sick since the last week of September but the cough has not gone away.  No medical history of breathing problems. No testing for COVID or flu.  Patient works as a Pharmacist, hospital.

## 2021-11-30 NOTE — ED Provider Notes (Signed)
[facility]    CSN: [id number] Arrival date & time: 11/30/21  1529      History   Chief Complaint Chief Complaint  Patient presents with   Cough    I think I may have bronchitis. I have had a terrible cough for 2.5 weeks. - Entered by patient   Shortness of Breath    HPI Danielle Perkins is a 26 y.o. female.   26 year old female presents with cough.  Patient indicates for the past 2-1/2 weeks she has been having persistent and progressive cough with chest congestion.  She indicates that she goes into coughing fits and spasms.  She is bringing up production which is thick and green.  She indicates that she does have some wheezing and shortness of breath that is associated with the cough.  She is using over-the-counter cough preparations which has not helped improve her symptoms.  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  She indicates she does have some mild upper respiratory symptoms such as rhinitis but the production is mainly clear.  She denies having fever, chills, body aches or pains, nausea or vomiting.  She indicates she is tolerating fluids well.  She indicates she has not been around any family or friends that have been sick or with similar symptoms.  She does not smoke or vape.   Cough Associated symptoms: shortness of breath   Shortness of Breath Associated symptoms: cough     Past Medical History:  Diagnosis Date   Anemia    Anxiety     Patient Active Problem List   Diagnosis Date Noted   IUD (intrauterine device) in place 08/08/2021   Anxiety 11/20/2020    Past Surgical History:  Procedure Laterality Date   CESAREAN SECTION N/A 06/07/2021   Procedure: CESAREAN SECTION;  Surgeon: Janyth Pupa, DO;  Location: [facility];  Service: Obstetrics;  Laterality: N/A;   NO PAST SURGERIES      OB History     Gravida  2   Para  1   Term  1   Preterm  0   AB  1    Living  1      SAB  1   IAB  0   Ectopic  0   Multiple  0   Live Births  1            Home Medications    Prior to Admission medications   Medication Sig Start Date End Date Taking? Authorizing Provider  azithromycin (ZITHROMAX) 250 MG tablet Take 1 tablet (250 mg total) by mouth daily. Take first 2 tablets together, then 1 every day until finished. 11/30/21  Yes Nyoka Lint, PA-C  escitalopram (LEXAPRO) 10 MG tablet Take 1 tablet by mouth daily.   Yes [provider]  HYDROcodone bit-homatropine (HYCODAN) 5-1.5 MG/5ML syrup Take 5 mLs by mouth every 6 (six) hours as needed for cough. 11/30/21  Yes Nyoka Lint, PA-C  famotidine (PEPCID) 20 MG tablet Take 1 tablet (20 mg total) by mouth daily. 11/18/20 11/18/21  Nugent, Gerrie Nordmann, NP  ferrous sulfate 325 (65 FE) MG tablet Take 325 mg by mouth daily with breakfast.    [provider]  Prenatal Vit-Fe Fumarate-FA (MULTIVITAMIN-PRENATAL) 27-0.8 MG TABS tablet Take 1 tablet by mouth daily at 12 noon.    [provider]    Family History Family History  Problem Relation Age of Onset  Graves' disease Mother    Breast cancer Maternal Grandmother        died at 72 years old   Diabetes type II Paternal Grandmother     Social History Social History   Tobacco Use   Smoking status: Never   Smokeless tobacco: Never  Vaping Use   Vaping Use: Never used  Substance Use Topics   Alcohol use: Not Currently    Comment: 3-4, usually wine, liquor occasionally   Drug use: Never     Allergies   Patient has no known allergies.   Review of Systems Review of Systems  Respiratory:  Positive for cough and shortness of breath.      Physical Exam Triage Vital Signs ED Triage Vitals  Enc Vitals Group     BP 11/30/21 1619 128/85     Pulse Rate 11/30/21 1619 82     Resp 11/30/21 1619 18     Temp 11/30/21 1619 98.9 F (37.2 C)     Temp Source 11/30/21 1619 Oral     SpO2 11/30/21 1619 97 %      Weight --      Height --      Head Circumference --      Peak Flow --      Pain Score 11/30/21 1547 0     Pain Loc --      Pain Edu? --      Excl. in [place]? --    No data found.  Updated Vital Signs BP 128/85 (BP Location: Right Arm)   Pulse 82   Temp 98.9 F (37.2 C) (Oral)   Resp 18   LMP  (LMP Unknown)   SpO2 97%   Breastfeeding No   Visual Acuity Right Eye Distance:   Left Eye Distance:   Bilateral Distance:    Right Eye Near:   Left Eye Near:    Bilateral Near:     Physical Exam Constitutional:      Appearance: She is well-developed.  HENT:     Right Ear: Tympanic membrane and ear canal normal.     Left Ear: Tympanic membrane and ear canal normal.     Mouth/Throat:     Mouth: Mucous membranes are moist.     Pharynx: Oropharynx is clear.  Cardiovascular:     Rate and Rhythm: Normal rate and regular rhythm.     Heart sounds: Normal heart sounds.  Pulmonary:     Effort: Pulmonary effort is normal.     Breath sounds: Normal breath sounds and air entry. No wheezing, rhonchi or rales.  Lymphadenopathy:     Cervical: No cervical adenopathy.  Neurological:     Mental Status: She is alert.      UC Treatments / Results  Labs (all labs ordered are listed, but only abnormal results are displayed) Labs Reviewed - No data to display  EKG   Radiology No results found.  Procedures Procedures (including critical care time)  Medications Ordered in UC Medications  albuterol (VENTOLIN HFA) 108 (90 Base) MCG/ACT inhaler 2 puff (2 puffs Inhalation Given 11/30/21 1650)    Initial Impression / Assessment and Plan / UC Course  I have reviewed the triage vital signs and the nursing notes.  Pertinent labs & imaging results that were available during my care of the patient were reviewed by me and considered in my medical decision making (see chart for details).    Plan: 1.  The acute cough will be treated with the following: A.  Hycodan cough syrup, 1 to 2  teaspoons every 6-8 hours needed for cough, be careful this medicine can make you drowsy, only to be used at night. 2.  The bronchitis will be treated with the following: A.  Albuterol inhaler, 2 puffs every 6 hours as needed for cough and wheezing. B.  Hycodan cough syrup, 1 to 2 teaspoons every 6-8 hours to control cough and chest congestion. C.  Zithromax 250 mg, 2 initially and then 1 daily until completed to treat respiratory infection. 3.  Patient advised to follow-up with PCP or return to urgent care if symptoms fail to improve. Final Clinical Impressions(s) / UC Diagnoses   Final diagnoses:  Acute cough  Bronchitis     Discharge Instructions      Advise use the albuterol inhaler, 2 puffs every 6 hours on a regular basis to help decrease cough, wheezing and shortness of breath. Advise use the Hycodan cough syrup 1 to 2 teaspoons every 6-8 hours only when at home as this may cause drowsiness.  You can use Mucinex DM during the daytime to help control the cough. Advised take the Zithromax, 2 tablets initially and then 1 daily until completed. Advised to follow-up with PCP or return to urgent care if symptoms fail to improve.    ED Prescriptions     Medication Sig Dispense Auth. Provider   HYDROcodone bit-homatropine (HYCODAN) 5-1.5 MG/5ML syrup Take 5 mLs by mouth every 6 (six) hours as needed for cough. 120 mL Nyoka Lint, PA-C   azithromycin (ZITHROMAX) 250 MG tablet Take 1 tablet (250 mg total) by mouth daily. Take first 2 tablets together, then 1 every day until finished. 6 tablet Nyoka Lint, PA-C      I have reviewed the PDMP during this encounter.   Nyoka Lint, PA-C 11/30/21 1652

## 2021-12-01 ENCOUNTER — Telehealth (HOSPITAL_COMMUNITY): Payer: Self-pay | Admitting: Physician Assistant

## 2021-12-01 MED ORDER — HYDROCODONE BIT-HOMATROP MBR 5-1.5 MG/5ML PO SOLN: 5.0000 mL | Freq: Four times a day (QID) | ORAL | 0 refills | Status: AC | PRN

## 2021-12-01 MED ORDER — AZITHROMYCIN 250 MG PO TABS: 250.0000 mg | ORAL_TABLET | Freq: Every day | ORAL | 0 refills | Status: AC

## 2021-12-03 NOTE — Telephone Encounter (Signed)
Medication sent to different Pharmacy at request of patient. Danielle Perkins

## 2021-12-20 ENCOUNTER — Encounter: Payer: Self-pay | Admitting: Family Medicine

## 2021-12-20 DIAGNOSIS — F419 Anxiety disorder, unspecified: Secondary | ICD-10-CM

## 2021-12-23 MED ORDER — ESCITALOPRAM OXALATE 10 MG PO TABS: 10.0000 mg | ORAL_TABLET | Freq: Every day | ORAL | 4 refills | Status: AC

## 2022-01-05 ENCOUNTER — Ambulatory Visit: Payer: Medicaid Other

## 2022-12-30 ENCOUNTER — Ambulatory Visit: Payer: Self-pay

## 2022-12-30 ENCOUNTER — Other Ambulatory Visit: Payer: Self-pay | Admitting: Family Medicine

## 2022-12-30 DIAGNOSIS — S93401A Sprain of unspecified ligament of right ankle, initial encounter: Secondary | ICD-10-CM
